# Patient Record
Sex: Female | Born: 1975
Health system: Southern US, Community
[De-identification: ages and names within clinical notes are randomized; demographics above are authoritative.]

## PROBLEM LIST (undated history)

## (undated) DIAGNOSIS — I10 Essential (primary) hypertension: Secondary | ICD-10-CM

## (undated) DIAGNOSIS — Z87768 Personal history of other specified (corrected) congenital malformations of integument, limbs and musculoskeletal system: Secondary | ICD-10-CM

## (undated) DIAGNOSIS — F410 Panic disorder [episodic paroxysmal anxiety] without agoraphobia: Secondary | ICD-10-CM

## (undated) DIAGNOSIS — F32A Depression, unspecified: Secondary | ICD-10-CM

## (undated) DIAGNOSIS — K219 Gastro-esophageal reflux disease without esophagitis: Secondary | ICD-10-CM

## (undated) DIAGNOSIS — F429 Obsessive-compulsive disorder, unspecified: Secondary | ICD-10-CM

## (undated) DIAGNOSIS — N6002 Solitary cyst of left breast: Secondary | ICD-10-CM

## (undated) DIAGNOSIS — F329 Major depressive disorder, single episode, unspecified: Secondary | ICD-10-CM

## (undated) DIAGNOSIS — K589 Irritable bowel syndrome without diarrhea: Secondary | ICD-10-CM

## (undated) DIAGNOSIS — D649 Anemia, unspecified: Secondary | ICD-10-CM

## (undated) HISTORY — DX: Depression, unspecified: F32.A

## (undated) HISTORY — DX: Panic disorder (episodic paroxysmal anxiety): F41.0

## (undated) HISTORY — DX: Personal history of other specified (corrected) congenital malformations of integument, limbs and musculoskeletal system: Z87.768

## (undated) HISTORY — PX: MYOMECTOMY: SHX85

## (undated) HISTORY — DX: Major depressive disorder, single episode, unspecified: F32.9

## (undated) HISTORY — DX: Essential (primary) hypertension: I10

## (undated) HISTORY — PX: ENDOMETRIAL ABLATION: SHX621

## (undated) HISTORY — DX: Irritable bowel syndrome, unspecified: K58.9

## (undated) HISTORY — DX: Obsessive-compulsive disorder, unspecified: F42.9

## (undated) HISTORY — DX: Anemia, unspecified: D64.9

## (undated) HISTORY — DX: Solitary cyst of left breast: N60.02

---

## 2001-07-16 ENCOUNTER — Other Ambulatory Visit: Admission: RE | Admit: 2001-07-16 | Discharge: 2001-07-16 | Payer: Self-pay | Admitting: Obstetrics and Gynecology

## 2001-11-30 ENCOUNTER — Inpatient Hospital Stay (HOSPITAL_COMMUNITY): Admission: AD | Admit: 2001-11-30 | Discharge: 2001-11-30 | Payer: Self-pay | Admitting: Obstetrics and Gynecology

## 2002-01-02 ENCOUNTER — Inpatient Hospital Stay (HOSPITAL_COMMUNITY): Admission: AD | Admit: 2002-01-02 | Discharge: 2002-01-02 | Payer: Self-pay | Admitting: Obstetrics and Gynecology

## 2002-01-26 ENCOUNTER — Inpatient Hospital Stay (HOSPITAL_COMMUNITY): Admission: AD | Admit: 2002-01-26 | Discharge: 2002-01-28 | Payer: Self-pay | Admitting: Obstetrics and Gynecology

## 2002-01-26 ENCOUNTER — Encounter (INDEPENDENT_AMBULATORY_CARE_PROVIDER_SITE_OTHER): Payer: Self-pay | Admitting: Specialist

## 2002-01-27 HISTORY — PX: TUBAL LIGATION: SHX77

## 2002-03-11 ENCOUNTER — Other Ambulatory Visit: Admission: RE | Admit: 2002-03-11 | Discharge: 2002-03-11 | Payer: Self-pay | Admitting: Obstetrics and Gynecology

## 2009-06-05 ENCOUNTER — Emergency Department (HOSPITAL_COMMUNITY): Admission: EM | Admit: 2009-06-05 | Discharge: 2009-06-05 | Payer: Self-pay | Admitting: Family Medicine

## 2010-06-18 ENCOUNTER — Ambulatory Visit (HOSPITAL_BASED_OUTPATIENT_CLINIC_OR_DEPARTMENT_OTHER): Admission: RE | Admit: 2010-06-18 | Discharge: 2010-06-18 | Payer: Self-pay | Admitting: Obstetrics & Gynecology

## 2010-10-10 ENCOUNTER — Ambulatory Visit (HOSPITAL_COMMUNITY): Admission: RE | Admit: 2010-10-10 | Discharge: 2010-10-10 | Payer: Self-pay | Admitting: Obstetrics & Gynecology

## 2011-02-05 ENCOUNTER — Other Ambulatory Visit: Payer: Self-pay | Admitting: Dermatology

## 2011-03-06 LAB — CBC
HCT: 35.1 % — ABNORMAL LOW (ref 36.0–46.0)
MCHC: 34.5 g/dL (ref 30.0–36.0)
Platelets: 329 10*3/uL (ref 150–400)
RDW: 13.3 % (ref 11.5–15.5)
WBC: 7.9 10*3/uL (ref 4.0–10.5)

## 2011-03-10 LAB — ESTRADIOL: Estradiol: 115.5 pg/mL

## 2011-04-01 LAB — POCT URINALYSIS DIP (DEVICE)
Bilirubin Urine: NEGATIVE
Hgb urine dipstick: NEGATIVE
Ketones, ur: NEGATIVE mg/dL
Nitrite: NEGATIVE
Protein, ur: 30 mg/dL — AB
pH: 6.5 (ref 5.0–8.0)

## 2011-05-10 NOTE — Op Note (Signed)
[facility]  Patient:    Sandra Nichols, Sandra Nichols Visit Number: [id number] MRN: [id number]          Service Type: OBS Location: 910A 9142 01 Attending Physician:  Wandalee Ferdinand Dictated by:   Rudy Jew Ashley Royalty, M.D. Proc. Date: 01/27/02 Admit Date:  01/26/2002 Discharge Date: 01/28/2002                             Operative Report  PREOPERATIVE DIAGNOSES:       1. Desire for attempt at permanent surgical                                  sterilization.                               2. Status post vaginal delivery, January 26, 2002.  POSTOPERATIVE DIAGNOSES:      1. Desire for attempt at permanent surgical                                  sterilization.                               2. Status post vaginal delivery, January 26, 2002.  OPERATION:                    Bilateral tubal sterilization procedure (left -                               Parkland, right - Pomeroy).  SURGEON:                      Rudy Jew. Ashley Royalty, M.D.  ANESTHESIA:                   Epidural.  ESTIMATED BLOOD LOSS:         50 cc.  COMPLICATIONS:                None.  PACKS AND DRAINS:             None.  DESCRIPTION OF PROCEDURE:     The patient was taken to the operating room and placed in the dorsal supine position.  The epidural was dosed for surgical anesthesia.  The abdomen was prepped and draped in the usual manner for abdominal surgery.  A 1.5 cm infraumbilical incision was made in the transverse plane.  The subcutaneous tissues were sharply and bluntly dissected down to the fascia which was elevated with hemostats and incised with Mayo scissors.  The incision was extended transversely.  Beneath the fascia, the peritoneum was identified and elevated with hemostats, and entered atraumatically with Metzenbaum scissors.  Small Richardson retractors were used to identify the appropriate  anatomy.   Initially, there were difficulties elevating the omentum and the bowel above the level of the uterus such that the fallopian tube could be visualized.  However, ultimately, the left fallopian tube was visualized.  It was grasped with a Babcock clamp and traced to its fimbriated end.  An avascular area in the distal isthmic to proximal ampullary portion was chosen for ligation.  A small tear was noted in the mesosalpinx.  Hence, an incision was made to perform a Parkland sterilization procedure.  A #1 plain catgut suture was placed on either end of the chosen segment of the tube to be ligated.  Sutures were tied.  A second suture was placed in the same locations.  The intervening portion of fallopian tube was excised and submitted to pathology for histologic studies.  There was a small bleeder noted in the mesosalpinx.  It was controlled with a 3-0 chromic interrupted figure-of-eight suture.  The remainder of the mesosalpinx was thoroughly inspected.  A couple of small bleeders were controlled with the Bovie.  Hemostasis was noted.  The remainder of the fallopian tube was allowed to return to the abdominal cavity.  The right fallopian tube was then identified after elevating the omentum and bowel cephalad.  The right fallopian tube was traced to its fimbriated end. An avascular area in the distal isthmic to proximal ampullary portion was chosen for ligation.  A #1 plain catgut suture was placed on this segment of tube and tied securely.  A second suture was placed and tied as well.  The intervening portion of the fallopian tube was excised with Metzenbaum scissors and submitted to pathology for histologic studies.  Hence, a Parkland procedure was performed on the left and a Pomeroy on the right.  Hemostasis was noted and the remainder of the fallopian tube was allowed to return to the abdominal cavity.  All instruments were removed.  The fascia was then closed with 0 Vicryl in a running  fashion.  The skin was closed with 3-0 chromic in a subcuticular fashion.  Approximately 10 cc of 0.25% bupivacaine were instilled into the abdominal incision to aid in postoperative analgesia.  The patient tolerated the procedure extremely well and was returned to the recovery room in good condition.  Due to the small laceration in the left mesosalpinx, we will obtain serial CBCs postoperatively to make sure there is no additional difficulty with hemostasis. Dictated by:   Rudy Jew Ashley Royalty, M.D. Attending Physician:  Wandalee Ferdinand DD:  01/27/02 TD:  01/28/02 Job: [id number]

## 2011-05-10 NOTE — Discharge Summary (Signed)
[facility]  Patient:    Sandra Nichols, Sandra Nichols Visit Number: [id number] MRN: [id number]          Service Type: OBS Location: 910A 9142 01 Attending Physician:  Wandalee Ferdinand Dictated by:   Rudy Jew Ashley Royalty, M.D. Admit Date:  01/26/2002 Discharge Date: 01/28/2002                             Discharge Summary  DISCHARGE DIAGNOSES:          1. Intrauterine pregnancy at [redacted] weeks                                  gestation, delivered.                               2. Term birth living child, vertex.                               3. Asthma.                               4. Rh negative.                               5. Group B Streptococcus positive.                               6. Musculoskeletal discomfort.                               7. Desire for attempted permanent surgical                                  sterilization.  PROCEDURES:                   1. Obstetrical delivery.                               2. Postpartum tubal sterilization procedure                                  (left Parkland, right Pomeroy).  CONSULTATIONS:                None.  DISCHARGE MEDICATIONS:        1. Tylox.                               2. Chromagen.  HISTORY OF PRESENT ILLNESS:   A 35 year old gravida 5, para 2, AB 2, at [redacted] weeks gestation.  Prenatal care was complicated by asthma, headaches followed by Dr. Orlin Hilding, Rh negative (received RhoGAM at [redacted] weeks gestation).  Also complicated by musculoskeletal discomfort.  The patient was admitted for induction secondary to musculoskeletal discomfort with advanced cervical changes at term.  She was group B  strep positive.  For the remaining history and physical, please see chart.  HOSPITAL COURSE:              The patient was admitted to [facility].  Initial laboratory studies were drawn.  Initial cervical examination revealed the cervix to be 3+ cm dilated, 75% effaced, and -1 to -2 station,  and vertex presentation.  Artificial rupture of membranes was accomplished, which revealed clear fluid.  The patient was given appropriate antibiotics.  She went on to labor and deliver on January 26, 2002, a 10 pound 4 ounce female, Apgars 8 at one minute and 9 at five minutes, sent to the newborn nursery.  Delivery was complicated by shoulder dystocia, which responded to episiotomy and McRoberts maneuver along with suprapubic pressure. There was no sequela.  The pediatrics team was at the delivery, and no evidence of any fracture was observed.  The arterial cord pH was 7.31.  There was a nuchal cord x1.  After delivery patient stated a desire for attempted permanent surgical sterilization.  This procedure was accomplished on January 27, 2002, satisfactorily.  The left fallopian tube underwent a Parkland procedure.  The right fallopian tube underwent a Pomeroy procedure.  The patient was then discharged home January 28, 2002, afebrile and in satisfactory condition.  ACCESSORY CLINICAL FINDINGS:  Hemoglobin and hematocrit on admission were 9.5 and 28.8, respectively.  On January 28 2002 the values were 8.2 and 25.5, respectively.  DISPOSITION:                  The patient will return to [facility] in four to six weeks for postpartum evaluation. Dictated by:   Rudy Jew Ashley Royalty, M.D. Attending Physician:  Wandalee Ferdinand DD:  03/07/02 TD:  03/08/02 Job: [id number]

## 2012-01-31 ENCOUNTER — Encounter (INDEPENDENT_AMBULATORY_CARE_PROVIDER_SITE_OTHER): Payer: Self-pay | Admitting: General Surgery

## 2012-02-05 ENCOUNTER — Ambulatory Visit (INDEPENDENT_AMBULATORY_CARE_PROVIDER_SITE_OTHER): Payer: BC Managed Care – PPO | Admitting: General Surgery

## 2012-02-05 ENCOUNTER — Encounter (INDEPENDENT_AMBULATORY_CARE_PROVIDER_SITE_OTHER): Payer: Self-pay | Admitting: General Surgery

## 2012-02-05 VITALS — BP 142/94 | HR 96 | Temp 97.5°F | Resp 16 | Ht 63.5 in | Wt 265.0 lb

## 2012-02-05 NOTE — Progress Notes (Signed)
Patient ID: Sandra Nichols, female   DOB: 02-Apr-1976, 36 y.o.   MRN: [id number]  Chief Complaint  Patient presents with  . Other    new bariatric- lap band    HPI Sandra Nichols is a 36 y.o. female.   HPI 36 year old Caucasian female referred by her primary care physician Dr Leda Quail for evaluation for weight loss surgery. The patient states that she is interested in weight loss surgery because she has been unsuccessful at sustained weight loss despite numerous attempts. She has tried the [employer], [employer], the [employer], and phentermine-all without any long-term success. Her most successful diet was with the [employer] and she lost 75 pounds. However it she regained all the weight back plus some additional weight.   Past Medical History  Diagnosis Date  . Irritable bowel syndrome   . Anemia   . Panic attack   . OCD (obsessive compulsive disorder)     Past Surgical History  Procedure Date  . Tubal ligation 01/27/2002  . Myomectomy   . Endometrial ablation     Family History  Problem Relation Age of Onset  . Heart disease Mother   . Diabetes Mother   . Diabetes Sister     Social History History  Substance Use Topics  . Smoking status: Never Smoker   . Smokeless tobacco: Not on file  . Alcohol Use: 1.0 - 1.5 oz/week    2-3 drink(s) per week    Allergies  Allergen Reactions  . Prednisone     Current Outpatient Prescriptions  Medication Sig Dispense Refill  . clonazePAM (KLONOPIN) 0.5 MG tablet Take 0.5 mg by mouth 2 (two) times daily as needed.      . ergocalciferol (VITAMIN D2) 50000 UNITS capsule Take 50,000 Units by mouth once a week.      Marland Kitchen FLUoxetine (PROZAC) 40 MG capsule Take 40 mg by mouth daily.      . IRON PO Take 65 mg by mouth daily.      . zaleplon (SONATA) 10 MG capsule Take 10 mg by mouth at bedtime.        Review of Systems Review of Systems  Constitutional: Negative for fever, activity change, appetite change and unexpected  weight change.  HENT: Negative for hearing loss, neck pain, neck stiffness, postnasal drip and tinnitus.   Eyes: Negative for photophobia, discharge and visual disturbance.  Respiratory: Negative for apnea, chest tightness, shortness of breath, wheezing and stridor.        DOE with 1 flight of stairs. Able to walk around shopping store without DOE. No orthopnea, no PND, no CP  Cardiovascular: Negative for chest pain, palpitations and leg swelling.  Gastrointestinal: Negative for nausea, vomiting, abdominal pain, blood in stool and abdominal distention.       Has some occasional heartburn. Has rare episodes of constipation/diarrhea. Daily BMs.  Genitourinary: Negative for dysuria, urgency, hematuria, difficulty urinating and pelvic pain.       Will occasionally have some stress incontinence with coughing, sneezing. Doesn't have to wear a pad. Has irreg cycles. Had endometrial ablation several yrs ago for heavy periods; G4P3  Musculoskeletal: Negative for back pain and arthralgias.  Skin: Negative for color change and pallor.  Neurological: Negative for dizziness, tremors, seizures, speech difficulty, numbness and headaches.  Hematological: Negative for adenopathy.  Psychiatric/Behavioral: Negative for suicidal ideas, sleep disturbance and self-injury.       Sees a psychologist for OCD. Has "emotional eating"    Blood  pressure 142/94, pulse 96, temperature 97.5 F (36.4 C), temperature source Temporal, resp. rate 16, height 5' 3.5" (1.613 m), weight 265 lb (120.203 kg).  Physical Exam Physical Exam  Vitals reviewed. Constitutional: She is oriented to person, place, and time. She appears well-developed and well-nourished. No distress.       Morbidly obese  HENT:  Head: Normocephalic and atraumatic.  Right Ear: External ear normal.  Left Ear: External ear normal.  Eyes: Conjunctivae are normal. No scleral icterus.  Neck: Normal range of motion. Neck supple. No tracheal deviation present.  No thyromegaly present.  Cardiovascular: Normal rate, regular rhythm, normal heart sounds and intact distal pulses.   Pulmonary/Chest: Effort normal and breath sounds normal. No respiratory distress. She has no wheezes.  Abdominal: Soft. Bowel sounds are normal. She exhibits no distension. There is no tenderness. There is no rebound.  Musculoskeletal: Normal range of motion. She exhibits no edema and no tenderness.  Lymphadenopathy:    She has no cervical adenopathy.  Neurological: She is alert and oriented to person, place, and time. She exhibits normal muscle tone.  Skin: Skin is warm and dry. No rash noted. She is not diaphoretic. No erythema. No pallor.  Psychiatric: She has a normal mood and affect. Her behavior is normal. Judgment and thought content normal.    Data Reviewed Self reported weight loss history form  Assessment    Morbid obesity OCD Possible stress urinary incontinence    Plan    The patient meets weight loss surgery criteria and I believe she is a candidate for surgery. She is undecided as to which procedure so we discussed both.   We discussed laparoscopic adjustable gastric banding. The patient was given Agricultural engineer. We discussed the risk and benefits of surgery including but not limited to bleeding, infection, injury to surrounding structures, blood clot formation such as deep venous thrombosis or pulmonary embolism, need to convert to an open procedure, band slippage, band erosion, failure to loose weight, port complications (leak or flippage), potential need for reoperative surgery, esophageal dilatation, worsening reflux, and vitamin deficiencies. We discussed the typical post operative recovery course. We discussed that their postoperative diet will be modified for several weeks. We specifically talked about the need to be on a liquid diet for one to 2 weeks after surgery. We also discussed the typical postoperative course with a laparoscopic adjustable  gastric band and the need for frequent postoperative visits to assess the volume status of the band.  We discussed the typical expected weight loss with a laparoscopic adjustable gastric band. I explained to the patient that they can expect to lose 40-60% of their excess body weight if they are compliant with their postoperative instructions. However I did explain that some patients loose less than 40% and some patients lose more than 60% of their excess body weight.  I explained that the likelihood of improvement in their obesity is good.  We discussed laparoscopic Roux-en-Y gastric bypass. The patient was given educational material. I quoted the patient that they can expect to lose 50-70% of their excess weight with the gastric bypass. We did discuss the possibility of weight regain several years after the procedure.  We discussed the risk and benefits of surgery including but not limited to anesthesia risk, bleeding, infection, blood clot formation, anastomotic leak, anastomotic stricture, ulcer formation, death, respiratory complications, intestinal blockage, internal hernia, gallstone formation, vitamin and nutritional deficiencies, injury to surrounding structures, failure to lose weight and mood changes.  We discussed that  before and after surgery that there would be an alteration in their diet. I explained that we have put them on a diet 2 weeks before surgery. I also explained that they would be on a liquid diet for 2 weeks after surgery. We discussed that they would have to avoid certain foods such as sugar after surgery. We discussed the importance of physical activity as well as compliance with our dietary and supplement recommendations.  The patient is going to think about the procedures and contact us with her decision. In the interim, we will start with our preop evaluation.   Mary Sella. Andrey Campanile, MD, FACS General, Bariatric, & Minimally Invasive Surgery [facility], [place]          [facility] M 02/05/2012, 1:26 PM

## 2012-02-05 NOTE — Patient Instructions (Signed)
Call my office once you have decided on which you procedure you would like or call if you have additional questions

## 2012-02-13 ENCOUNTER — Ambulatory Visit (HOSPITAL_COMMUNITY)
Admission: RE | Admit: 2012-02-13 | Discharge: 2012-02-13 | Disposition: A | Discharge status: Home / Self Care | Payer: BC Managed Care – PPO | Source: Ambulatory Visit | Attending: General Surgery | Admitting: General Surgery

## 2012-02-13 ENCOUNTER — Other Ambulatory Visit: Payer: Self-pay

## 2012-02-13 ENCOUNTER — Telehealth (INDEPENDENT_AMBULATORY_CARE_PROVIDER_SITE_OTHER): Payer: Self-pay | Admitting: General Surgery

## 2012-02-13 DIAGNOSIS — K219 Gastro-esophageal reflux disease without esophagitis: Secondary | ICD-10-CM | POA: Insufficient documentation

## 2012-02-13 DIAGNOSIS — K449 Diaphragmatic hernia without obstruction or gangrene: Secondary | ICD-10-CM | POA: Insufficient documentation

## 2012-02-13 DIAGNOSIS — K8020 Calculus of gallbladder without cholecystitis without obstruction: Secondary | ICD-10-CM | POA: Insufficient documentation

## 2012-02-13 DIAGNOSIS — I517 Cardiomegaly: Secondary | ICD-10-CM | POA: Insufficient documentation

## 2012-02-13 DIAGNOSIS — Z6841 Body Mass Index (BMI) 40.0 and over, adult: Secondary | ICD-10-CM | POA: Insufficient documentation

## 2012-02-13 LAB — LIPID PANEL
Cholesterol: 200 mg/dL (ref 0–200)
HDL: 45 mg/dL (ref >39)
LDL Cholesterol: 131 mg/dL — ABNORMAL HIGH (ref 0–99)
Total CHOL/HDL Ratio: 4.4 Ratio
Triglycerides: 118 mg/dL (ref <150)
VLDL: 24 mg/dL (ref 0–40)

## 2012-02-13 LAB — COMPREHENSIVE METABOLIC PANEL
ALT: 30 U/L (ref 0–35)
AST: 17 U/L (ref 0–37)
Albumin: 4.2 g/dL (ref 3.5–5.2)
Alkaline Phosphatase: 104 U/L (ref 39–117)
BUN: 13 mg/dL (ref 6–23)
CO2: 24 mEq/L (ref 19–32)
Calcium: 9.5 mg/dL (ref 8.4–10.5)
Chloride: 102 mEq/L (ref 96–112)
Creat: 0.63 mg/dL (ref 0.50–1.10)
Glucose, Bld: 88 mg/dL (ref 70–99)
Potassium: 4.3 mEq/L (ref 3.5–5.3)
Sodium: 137 mEq/L (ref 135–145)
Total Bilirubin: 0.6 mg/dL (ref 0.3–1.2)
Total Protein: 7.6 g/dL (ref 6.0–8.3)

## 2012-02-13 LAB — H. PYLORI ANTIBODY, IGG: H Pylori IgG: 0.49 {ISR}

## 2012-02-13 LAB — CBC WITH DIFFERENTIAL/PLATELET
Basophils Relative: 0 % (ref 0–1)
Eosinophils Absolute: 0.1 10*3/uL (ref 0.0–0.7)
HCT: 40.1 % (ref 36.0–46.0)
Hemoglobin: 12.8 g/dL (ref 12.0–15.0)
MCH: 28.8 pg (ref 26.0–34.0)
MCHC: 31.9 g/dL (ref 30.0–36.0)
Monocytes Absolute: 0.3 10*3/uL (ref 0.1–1.0)
Monocytes Relative: 4 % (ref 3–12)
RDW: 14 % (ref 11.5–15.5)

## 2012-02-13 LAB — TSH: TSH: 2.357 u[IU]/mL (ref 0.350–4.500)

## 2012-02-13 LAB — T4: T4, Total: 10.5 ug/dL (ref 5.0–12.5)

## 2012-02-13 NOTE — Telephone Encounter (Signed)
Message copied by Liliana Cline on Thu Feb 13, 2012  8:59 AM ------      Message from: Andrey Campanile, ERIC M      Created: Thu Feb 13, 2012  8:16 AM       pls forward results to pt's PCP

## 2012-02-13 NOTE — Telephone Encounter (Signed)
Recent labs faxed to Dr Leda Quail.

## 2012-02-24 ENCOUNTER — Encounter: Payer: Self-pay | Admitting: *Deleted

## 2012-02-24 ENCOUNTER — Encounter: Payer: BC Managed Care – PPO | Attending: General Surgery | Admitting: *Deleted

## 2012-02-24 DIAGNOSIS — Z713 Dietary counseling and surveillance: Secondary | ICD-10-CM | POA: Insufficient documentation

## 2012-02-24 DIAGNOSIS — Z01818 Encounter for other preprocedural examination: Secondary | ICD-10-CM | POA: Insufficient documentation

## 2012-02-24 NOTE — Progress Notes (Addendum)
  Pre-Op Assessment Visit:  Pre-Operative Gastric Bypass Surgery  Medical Nutrition Therapy:  Appt start time: 1000 end time:  1100.  Patient was seen on 02/24/2012 for Pre-Operative Gastric Bypass Nutrition Assessment. Assessment and letter of approval faxed to [facility] Bariatric Surgery Program coordinator on 02/24/12.  Approval letter sent to [facility] and will be available in the chart under the media tab.  TANITA  BODY COMP RESULTS  02/24/12     %Fat 54.3     FM (lbs) 141.5     FFM (lbs) 119.5     TBW (lbs) 87.5      Handouts given during visit include:  Pre-Op Goals Handout  Protein Supplement Handout  Bariatric Surgery Support Group Schedule  Patient to call for Pre-Op and Post-Op Nutrition Education at [facility] when surgery is scheduled.

## 2012-02-24 NOTE — Patient Instructions (Signed)
   Follow Pre-Op Nutrition Goals to prepare for Gastric Bypass Surgery.   Call the [facility] at [phone] once you have been given your surgery date to enrolled in the Pre-Op Nutrition Class. You will need to attend this nutrition class 3-4 weeks prior to your surgery. 

## 2012-03-18 ENCOUNTER — Other Ambulatory Visit (INDEPENDENT_AMBULATORY_CARE_PROVIDER_SITE_OTHER): Payer: Self-pay | Admitting: General Surgery

## 2012-03-26 ENCOUNTER — Encounter: Payer: BC Managed Care – PPO | Attending: General Surgery | Admitting: *Deleted

## 2012-03-26 DIAGNOSIS — Z713 Dietary counseling and surveillance: Secondary | ICD-10-CM | POA: Insufficient documentation

## 2012-03-26 DIAGNOSIS — Z01818 Encounter for other preprocedural examination: Secondary | ICD-10-CM | POA: Insufficient documentation

## 2012-03-26 NOTE — Patient Instructions (Addendum)
Follow:   Pre-Op Diet per MD 2 weeks prior to surgery  Phase 2- Liquids (clear/full) 2 weeks after surgery  Vitamin/Mineral/Calcium guidelines for purchasing bariatric supplements  Exercise guidelines pre and post-op per MD  Follow-up at [facility] in 2 weeks post-op for diet advancement. Contact Mohannad Olivero as needed with questions/concerns. 

## 2012-03-26 NOTE — Progress Notes (Addendum)
  Bariatric Class:  Appt start time: 0830 end time:  0930.  Pre-Operative Nutrition Class  Patient was seen on 03/26/2012 for Pre-Operative Bariatric Surgery Education at the [facility].  Surgery date: 04/13/12 Surgery type: Gastric Bypass  Samples given per MNT protocol: Bariatric Advantage Multivitamin [facility]) Lot #  [id number] Exp: 09/13  Bariatric Advantage Calcium Citrate Lozenge (Chocolate) Lot # [id number] Exp: 09/13  Bariatric Advantage B-12 (Peppermint) Lot # [id number] Exp: 05/13  Celebrate Vitamins Calcium Citrate (Strawberry Creme) Lot # [id number] Exp: 04/13  Corliss Marcus Protein Powder (Chocolate Splendor) Lot # [id number] Exp: 05/14  The following the learning objective met by the patient during this course:   Identifies Pre-Op Dietary Goals and will begin 2 weeks pre-operatively  Identifies appropriate sources of fluids and proteins   States protein recommendations and appropriate sources pre and post-operatively  Identifies Post-Operative Dietary Goals and will follow for 2 weeks post-operatively  Identifies appropriate multivitamin and calcium sources  Describes the need for physical activity post-operatively and will follow MD recommendations  States when to call healthcare provider regarding medication questions or post-operative complications  Handouts given during class include:  Pre-Op Bariatric Surgery Diet Handout  Protein Shake Handout  Post-Op Bariatric Surgery Nutrition Handout  BELT Program Information Flyer  Support Group Information Flyer  Follow-Up Plan: Patient will follow-up at [facility] 2 weeks post operatively for diet advancement per MD.

## 2012-04-10 ENCOUNTER — Encounter (HOSPITAL_COMMUNITY)
Admission: RE | Admit: 2012-04-10 | Discharge: 2012-04-10 | Disposition: A | Discharge status: Home / Self Care | Payer: BC Managed Care – PPO | Source: Ambulatory Visit | Attending: General Surgery | Admitting: General Surgery

## 2012-04-10 ENCOUNTER — Encounter (HOSPITAL_COMMUNITY): Payer: Self-pay

## 2012-04-10 HISTORY — DX: Gastro-esophageal reflux disease without esophagitis: K21.9

## 2012-04-10 LAB — COMPREHENSIVE METABOLIC PANEL
AST: 32 U/L (ref 0–37)
Albumin: 4.2 g/dL (ref 3.5–5.2)
Alkaline Phosphatase: 95 U/L (ref 39–117)
BUN: 13 mg/dL (ref 6–23)
Chloride: 99 mEq/L (ref 96–112)
Potassium: 4.2 mEq/L (ref 3.5–5.1)
Total Bilirubin: 0.5 mg/dL (ref 0.3–1.2)

## 2012-04-10 LAB — CBC
HCT: 39 % (ref 36.0–46.0)
Platelets: 356 10*3/uL (ref 150–400)
RBC: 4.61 MIL/uL (ref 3.87–5.11)
RDW: 13.2 % (ref 11.5–15.5)
WBC: 7.1 10*3/uL (ref 4.0–10.5)

## 2012-04-10 LAB — DIFFERENTIAL
Basophils Relative: 0 % (ref 0–1)
Eosinophils Absolute: 0.1 10*3/uL (ref 0.0–0.7)
Monocytes Absolute: 0.3 10*3/uL (ref 0.1–1.0)
Monocytes Relative: 4 % (ref 3–12)

## 2012-04-10 NOTE — Patient Instructions (Signed)
20 Sandra Nichols  04/10/2012   Your procedure is scheduled on: 04/20/12   Monday   Surgery [id number]    Report to [facility] at  0515     AM.  Call this number if you have problems the morning of surgery: [phone]     Or PST   [phone]  [facility]   Remember:             Stop aspirin, antiinflammatories or herbal meds 7 days pre op  Do not eat food:After Midnight.Sunday  Night  Or as  directed by office for BOWEL PREP  May have clear liquids: until midnight or bedtime Sunday NIGHT          INCREASE FLUIDS SUNDAY  Clear liquids include soda, tea, black coffee, apple or grape juice, broth.  Take these medicines the morning of surgery with A SIP OF WATER:PROZAC        Klonopin if needed   Do not wear jewelry, make-up or nail polish.  Do not wear lotions, powders, or perfumes. You may wear deodorant.  Do not shave 48 hours prior to surgery.  Do not bring valuables to the hospital.  Contacts, dentures or bridgework may not be worn into surgery.  Leave suitcase in the car. After surgery it may be brought to your room.  For patients admitted to the hospital, checkout time is 11:00 AM the day of discharge.   Patients discharged the day of surgery will not be allowed to drive home.  Name and phone number of your driver: [name]                                                                     Special Instructions: CHG Shower Use Special Wash: 1/2 bottle night before surgery and 1/2 bottle morning of surgery. REGULAR SOAP FACE AND PRIVATES              LADIES- NO SHAVING 48 HOURS BEFORE USING BETASEPT SOAP.                 Please read over the following fact sheets that you were given: MRSA Information

## 2012-04-10 NOTE — Pre-Procedure Instructions (Signed)
PST NOTE- states hasnt been on any estrogens, progesterones x 1 month, has appt with Dr Andrey Campanile 04/13/12- clear liquid information given and informed they would give her the bowel prep instr and scripts

## 2012-04-10 NOTE — Pre-Procedure Instructions (Signed)
EKG and chest x ray 2/13 EPIC

## 2012-04-13 ENCOUNTER — Ambulatory Visit (INDEPENDENT_AMBULATORY_CARE_PROVIDER_SITE_OTHER): Payer: BC Managed Care – PPO | Admitting: General Surgery

## 2012-04-13 ENCOUNTER — Encounter (INDEPENDENT_AMBULATORY_CARE_PROVIDER_SITE_OTHER): Payer: Self-pay | Admitting: General Surgery

## 2012-04-13 VITALS — BP 134/82 | HR 72 | Temp 97.4°F | Resp 16 | Ht 63.0 in | Wt 262.1 lb

## 2012-04-13 MED ORDER — PEG 3350-KCL-NABCB-NACL-NASULF 236 G PO SOLR: 4.0000 L | Freq: Once | ORAL | Status: AC

## 2012-04-13 NOTE — Progress Notes (Signed)
Patient ID: Sandra Nichols, female   DOB: 25-Jan-1976, 36 y.o.   MRN: [id number]  Chief Complaint  Patient presents with  . Bariatric Pre-op    HPI Sandra Nichols is a 36 y.o. female.   HPI 36 year old Caucasian female comes in to discuss her upcoming gastric bypass surgery for this Monday. I last saw her in February 2013. She has completed her preoperative evaluation and has been approved for laparoscopic Roux-en-Y gastric bypass. The patient denies any new medical problems or surgeries. She denies any new medications. She denies any fever, chills, nausea, vomiting, diarrhea or constipation. She denies any trips to the emergency room or the hospital.   Past Medical History  Diagnosis Date  . Irritable bowel syndrome   . Anemia   . Panic attack   . OCD (obsessive compulsive disorder)   . GERD (gastroesophageal reflux disease)     Past Surgical History  Procedure Date  . Myomectomy   . Endometrial ablation   . Tubal ligation 01/27/2002    Family History  Problem Relation Age of Onset  . Heart disease Mother   . Diabetes Mother   . Diabetes Sister   . Cancer Paternal Grandmother     colon    Social History History  Substance Use Topics  . Smoking status: Never Smoker   . Smokeless tobacco: Never Used  . Alcohol Use: No    Allergies  Allergen Reactions  . Prednisone Other (See Comments)    Psychological rxn: pt reports "losing time"    Current Outpatient Prescriptions  Medication Sig Dispense Refill  . clonazePAM (KLONOPIN) 0.5 MG tablet Take 0.5 mg by mouth 2 (two) times daily as needed. anxiety      . ergocalciferol (VITAMIN D2) 50000 UNITS capsule Take 50,000 Units by mouth once a week. wednesday      . FLUoxetine (PROZAC) 40 MG capsule Take 40 mg by mouth every morning.       . IRON PO Take 65 mg by mouth every other day.       . polyethylene glycol (GOLYTELY) 236 G solution Take 4,000 mLs by mouth once.  4000 mL  0  . zaleplon (SONATA) 10 MG capsule Take 10 mg by  mouth at bedtime as needed. sleep        Review of Systems Review of Systems  Constitutional: Negative for fever, diaphoresis, activity change and unexpected weight change.  HENT: Negative for hearing loss, nosebleeds and neck pain.   Eyes: Negative for photophobia and visual disturbance.  Respiratory: Negative for chest tightness, shortness of breath and wheezing.   Cardiovascular: Negative for chest pain, palpitations and leg swelling.  Gastrointestinal: Negative for nausea, vomiting, abdominal pain, diarrhea, constipation and abdominal distention.  Genitourinary: Negative for dysuria, flank pain and pelvic pain.  Musculoskeletal: Negative for back pain and gait problem.  Skin: Negative for color change and pallor.  Neurological: Negative for seizures, syncope, speech difficulty and light-headedness.  Hematological: Negative for adenopathy.  Psychiatric/Behavioral:       Some ocd    Blood pressure 134/82, pulse 72, temperature 97.4 F (36.3 C), temperature source Temporal, resp. rate 16, height 5\' 3"  (1.6 m), weight 262 lb 2 oz (118.899 kg).  Physical Exam Physical Exam  Vitals reviewed. Constitutional: She is oriented to person, place, and time. She appears well-developed and well-nourished. No distress.  HENT:  Head: Normocephalic and atraumatic.  Right Ear: External ear normal.  Left Ear: External ear normal.  Eyes: Conjunctivae are normal. No  scleral icterus.  Neck: Normal range of motion. Neck supple. No tracheal deviation present. No thyromegaly present.  Cardiovascular: Normal rate, regular rhythm and normal heart sounds.   Pulmonary/Chest: Effort normal and breath sounds normal. No respiratory distress. She has no wheezes.  Abdominal: Soft. Bowel sounds are normal. She exhibits no distension. There is no tenderness. There is no rebound.  Musculoskeletal: Normal range of motion. She exhibits no edema and no tenderness.  Neurological: She is alert and oriented to person,  place, and time. She exhibits normal muscle tone.  Skin: Skin is warm and dry. No rash noted. No erythema.  Psychiatric: She has a normal mood and affect. Her behavior is normal. Judgment and thought content normal.    Data Reviewed My note from 01/2012 UGI - small rt sided hiatal hernia U/S - single gallstone Feb 2013 labs - some mild elevated TG; nml Hgb and Hct  Assessment    Morbid obesity BMI 46.4 Hypertriglyceridemia    Plan    We reviewed her upper gi, u/s, labs.   We discussed the importance of the preoperative diet and bowel prep.   We re-discussed laparoscopic Roux-en-Y gastric bypass. We discussed the preoperative, operative and postoperative process.  I explained the surgery in detail including the performance of an EGD near the end of the surgery and an Upper GI swallow study on POD 1. We discussed the typical hospital course including a 2-3 day stay baring any complications.   The patient was given educational material. I quoted the patient that they can expect to lose 50-70% of their excess weight with the gastric bypass. We did discuss the possibility of weight regain several years after the procedure.  We discussed that before and after surgery that there would be an alteration in their diet. I explained that we have put them on a diet 2 weeks before surgery. I also explained that they would be on a liquid diet for 2 weeks after surgery. We discussed that they would have to avoid certain foods such as sugar after surgery. We discussed the importance of physical activity as well as compliance with our dietary and supplement recommendations and routine follow-up.  All of her and her husband's questions were asked and answered.   LAPAROSCOPIC ROUX-EN-Y GASTRIC BYPASS this Monday.  Sandra Nichols. Andrey Campanile, MD, FACS General, Bariatric, & Minimally Invasive Surgery [facility], [place]         [facility] M 04/13/2012, 2:37 PM

## 2012-04-20 ENCOUNTER — Encounter (HOSPITAL_COMMUNITY)
Admission: RE | Disposition: A | Discharge status: Home / Self Care | Payer: Self-pay | Source: Ambulatory Visit | Attending: General Surgery

## 2012-04-20 ENCOUNTER — Ambulatory Visit (HOSPITAL_COMMUNITY): Payer: BC Managed Care – PPO | Admitting: Certified Registered Nurse Anesthetist

## 2012-04-20 ENCOUNTER — Inpatient Hospital Stay (HOSPITAL_COMMUNITY)
Admission: RE | Admit: 2012-04-20 | Discharge: 2012-04-22 | DRG: 288 | Disposition: A | Discharge status: Home / Self Care | Payer: BC Managed Care – PPO | Source: Ambulatory Visit | Attending: General Surgery | Admitting: General Surgery

## 2012-04-20 ENCOUNTER — Encounter (HOSPITAL_COMMUNITY): Payer: Self-pay | Admitting: Certified Registered Nurse Anesthetist

## 2012-04-20 ENCOUNTER — Encounter (HOSPITAL_COMMUNITY): Payer: Self-pay | Admitting: *Deleted

## 2012-04-20 ENCOUNTER — Encounter (HOSPITAL_COMMUNITY): Payer: Self-pay | Admitting: Surgery

## 2012-04-20 DIAGNOSIS — K2100 Gastro-esophageal reflux disease with esophagitis, without bleeding: Secondary | ICD-10-CM

## 2012-04-20 DIAGNOSIS — K219 Gastro-esophageal reflux disease without esophagitis: Secondary | ICD-10-CM | POA: Diagnosis present

## 2012-04-20 DIAGNOSIS — Z6841 Body Mass Index (BMI) 40.0 and over, adult: Secondary | ICD-10-CM

## 2012-04-20 DIAGNOSIS — F429 Obsessive-compulsive disorder, unspecified: Secondary | ICD-10-CM | POA: Diagnosis present

## 2012-04-20 DIAGNOSIS — Z9884 Bariatric surgery status: Secondary | ICD-10-CM

## 2012-04-20 DIAGNOSIS — E781 Pure hyperglyceridemia: Secondary | ICD-10-CM | POA: Diagnosis present

## 2012-04-20 HISTORY — PX: GASTRIC ROUX-EN-Y: SHX5262

## 2012-04-20 SURGERY — LAPAROSCOPIC ROUX-EN-Y GASTRIC BYPASS WITH UPPER ENDOSCOPY
Anesthesia: General | Site: Abdomen | Wound class: Clean Contaminated

## 2012-04-20 MED ORDER — TISSEEL VH 10 ML EX KIT
PACK | CUTANEOUS | Status: DC | PRN
  Administered 2012-04-20: 10 mL

## 2012-04-20 MED ORDER — UNJURY CHICKEN SOUP POWDER: 2.0000 [oz_av] | Freq: Four times a day (QID) | ORAL | Status: DC

## 2012-04-20 MED ORDER — HYDROMORPHONE HCL PF 1 MG/ML IJ SOLN
INTRAMUSCULAR | Status: DC | PRN
  Administered 2012-04-20: .5 mg via INTRAVENOUS
  Administered 2012-04-20: 0.5 mg via INTRAVENOUS

## 2012-04-20 MED ORDER — LIDOCAINE HCL (CARDIAC) 20 MG/ML IV SOLN
INTRAVENOUS | Status: DC | PRN
  Administered 2012-04-20: 100 mg via INTRAVENOUS

## 2012-04-20 MED ORDER — KETOROLAC TROMETHAMINE 30 MG/ML IJ SOLN: 15.0000 mg | Freq: Once | INTRAMUSCULAR | Status: DC | PRN

## 2012-04-20 MED ORDER — ONDANSETRON HCL 4 MG/2ML IJ SOLN
4.0000 mg | INTRAMUSCULAR | Status: DC | PRN
  Administered 2012-04-20 – 2012-04-22 (×4): 4 mg via INTRAVENOUS
  Filled 2012-04-20 (×4): qty 2

## 2012-04-20 MED ORDER — DEXAMETHASONE SODIUM PHOSPHATE 10 MG/ML IJ SOLN
INTRAMUSCULAR | Status: DC | PRN
  Administered 2012-04-20: 10 mg via INTRAVENOUS

## 2012-04-20 MED ORDER — ONDANSETRON HCL 4 MG/2ML IJ SOLN
INTRAMUSCULAR | Status: DC | PRN
  Administered 2012-04-20: 4 mg via INTRAVENOUS

## 2012-04-20 MED ORDER — HEPARIN SODIUM (PORCINE) 5000 UNIT/ML IJ SOLN
5000.0000 [IU] | Freq: Three times a day (TID) | INTRAMUSCULAR | Status: DC
  Administered 2012-04-20 – 2012-04-22 (×5): 5000 [IU] via SUBCUTANEOUS
  Filled 2012-04-20 (×8): qty 1

## 2012-04-20 MED ORDER — MIDAZOLAM HCL 5 MG/5ML IJ SOLN
INTRAMUSCULAR | Status: DC | PRN
  Administered 2012-04-20: 2 mg via INTRAVENOUS

## 2012-04-20 MED ORDER — UNJURY VANILLA POWDER
2.0000 [oz_av] | Freq: Four times a day (QID) | ORAL | Status: DC
  Administered 2012-04-22: 2 [oz_av] via ORAL

## 2012-04-20 MED ORDER — DROPERIDOL 2.5 MG/ML IJ SOLN
INTRAMUSCULAR | Status: DC | PRN
  Administered 2012-04-20: 0.625 mg via INTRAVENOUS

## 2012-04-20 MED ORDER — LACTATED RINGERS IV SOLN
INTRAVENOUS | Status: DC | PRN
  Administered 2012-04-20 (×2): via INTRAVENOUS

## 2012-04-20 MED ORDER — PROMETHAZINE HCL 25 MG/ML IJ SOLN: 6.2500 mg | INTRAMUSCULAR | Status: DC | PRN

## 2012-04-20 MED ORDER — MORPHINE SULFATE 2 MG/ML IJ SOLN
2.0000 mg | INTRAMUSCULAR | Status: DC | PRN
  Administered 2012-04-20 (×2): 4 mg via INTRAVENOUS
  Administered 2012-04-21: 2 mg via INTRAVENOUS
  Filled 2012-04-20: qty 2
  Filled 2012-04-20: qty 1
  Filled 2012-04-20: qty 2

## 2012-04-20 MED ORDER — FIBRIN SEALANT COMPONENT 5 ML EX KIT
PACK | CUTANEOUS | Status: AC
  Filled 2012-04-20: qty 2

## 2012-04-20 MED ORDER — PROPOFOL 10 MG/ML IV EMUL
INTRAVENOUS | Status: DC | PRN
  Administered 2012-04-20: 200 mg via INTRAVENOUS

## 2012-04-20 MED ORDER — OXYCODONE-ACETAMINOPHEN 5-325 MG/5ML PO SOLN
5.0000 mL | ORAL | Status: DC | PRN
  Administered 2012-04-21: 5 mL via ORAL
  Administered 2012-04-21: 10 mL via ORAL
  Filled 2012-04-20: qty 5
  Filled 2012-04-20: qty 10

## 2012-04-20 MED ORDER — HYDROMORPHONE HCL PF 1 MG/ML IJ SOLN: 0.2500 mg | INTRAMUSCULAR | Status: DC | PRN

## 2012-04-20 MED ORDER — HEPARIN SODIUM (PORCINE) 5000 UNIT/ML IJ SOLN
INTRAMUSCULAR | Status: AC
  Filled 2012-04-20: qty 1

## 2012-04-20 MED ORDER — BUPIVACAINE-EPINEPHRINE 0.25% -1:200000 IJ SOLN
INTRAMUSCULAR | Status: DC | PRN
  Administered 2012-04-20: 35 mL

## 2012-04-20 MED ORDER — ROCURONIUM BROMIDE 100 MG/10ML IV SOLN
INTRAVENOUS | Status: DC | PRN
  Administered 2012-04-20: 10 mg via INTRAVENOUS
  Administered 2012-04-20: 20 mg via INTRAVENOUS
  Administered 2012-04-20 (×3): 10 mg via INTRAVENOUS
  Administered 2012-04-20: 50 mg via INTRAVENOUS

## 2012-04-20 MED ORDER — KCL IN DEXTROSE-NACL 20-5-0.45 MEQ/L-%-% IV SOLN
INTRAVENOUS | Status: DC
  Administered 2012-04-20 – 2012-04-21 (×2): via INTRAVENOUS
  Administered 2012-04-21 – 2012-04-22 (×3): 125 mL via INTRAVENOUS
  Filled 2012-04-20 (×9): qty 1000

## 2012-04-20 MED ORDER — SCOPOLAMINE 1 MG/3DAYS TD PT72
MEDICATED_PATCH | TRANSDERMAL | Status: AC
  Filled 2012-04-20: qty 1

## 2012-04-20 MED ORDER — GLYCOPYRROLATE 0.2 MG/ML IJ SOLN
INTRAMUSCULAR | Status: DC | PRN
  Administered 2012-04-20: .8 mg via INTRAVENOUS

## 2012-04-20 MED ORDER — LACTATED RINGERS IR SOLN
Status: DC | PRN
  Administered 2012-04-20: 3000 mL

## 2012-04-20 MED ORDER — FENTANYL CITRATE 0.05 MG/ML IJ SOLN
INTRAMUSCULAR | Status: DC | PRN
  Administered 2012-04-20: 50 ug via INTRAVENOUS
  Administered 2012-04-20: 100 ug via INTRAVENOUS
  Administered 2012-04-20 (×4): 50 ug via INTRAVENOUS

## 2012-04-20 MED ORDER — CEFOXITIN SODIUM-DEXTROSE 1-4 GM-% IV SOLR (PREMIX)
INTRAVENOUS | Status: AC
  Filled 2012-04-20: qty 100

## 2012-04-20 MED ORDER — SCOPOLAMINE 1 MG/3DAYS TD PT72
MEDICATED_PATCH | TRANSDERMAL | Status: DC | PRN
  Administered 2012-04-20: 1 via TRANSDERMAL

## 2012-04-20 MED ORDER — STERILE WATER FOR IRRIGATION IR SOLN
Status: DC | PRN
  Administered 2012-04-20: 1500 mL

## 2012-04-20 MED ORDER — DEXTROSE 5 % IV SOLN
2.0000 g | INTRAVENOUS | Status: AC
  Administered 2012-04-20: 2 g via INTRAVENOUS
  Filled 2012-04-20: qty 2

## 2012-04-20 MED ORDER — ACETAMINOPHEN 160 MG/5ML PO SOLN: 650.0000 mg | ORAL | Status: DC | PRN

## 2012-04-20 MED ORDER — BUPIVACAINE-EPINEPHRINE 0.25% -1:200000 IJ SOLN
INTRAMUSCULAR | Status: AC
  Filled 2012-04-20: qty 1

## 2012-04-20 MED ORDER — NEOSTIGMINE METHYLSULFATE 1 MG/ML IJ SOLN
INTRAMUSCULAR | Status: DC | PRN
  Administered 2012-04-20: 5 mg via INTRAVENOUS

## 2012-04-20 MED ORDER — 0.9 % SODIUM CHLORIDE (POUR BTL) OPTIME
TOPICAL | Status: DC | PRN
  Administered 2012-04-20: 1000 mL

## 2012-04-20 MED ORDER — UNJURY CHOCOLATE CLASSIC POWDER: 2.0000 [oz_av] | Freq: Four times a day (QID) | ORAL | Status: DC

## 2012-04-20 MED ORDER — HEPARIN SODIUM (PORCINE) 5000 UNIT/ML IJ SOLN
5000.0000 [IU] | INTRAMUSCULAR | Status: AC
  Administered 2012-04-20: 5000 [IU] via SUBCUTANEOUS

## 2012-04-20 MED ORDER — PANTOPRAZOLE SODIUM 40 MG IV SOLR
40.0000 mg | INTRAVENOUS | Status: DC
  Administered 2012-04-20 – 2012-04-21 (×2): 40 mg via INTRAVENOUS
  Filled 2012-04-20 (×3): qty 40

## 2012-04-20 MED ORDER — ACETAMINOPHEN 10 MG/ML IV SOLN
INTRAVENOUS | Status: AC
  Filled 2012-04-20: qty 100

## 2012-04-20 MED ORDER — ACETAMINOPHEN 10 MG/ML IV SOLN
1000.0000 mg | Freq: Four times a day (QID) | INTRAVENOUS | Status: AC
  Administered 2012-04-20 – 2012-04-21 (×4): 1000 mg via INTRAVENOUS
  Filled 2012-04-20 (×7): qty 100

## 2012-04-20 MED ORDER — LORAZEPAM 2 MG/ML IJ SOLN: 0.5000 mg | Freq: Two times a day (BID) | INTRAMUSCULAR | Status: DC | PRN

## 2012-04-20 MED ORDER — ACETAMINOPHEN 10 MG/ML IV SOLN
INTRAVENOUS | Status: DC | PRN
  Administered 2012-04-20: 1000 mg via INTRAVENOUS

## 2012-04-20 SURGICAL SUPPLY — 122 items
ADH SKN CLS APL DERMABOND .7 (GAUZE/BANDAGES/DRESSINGS) ×4
APL SKNCLS STERI-STRIP NONHPOA (GAUZE/BANDAGES/DRESSINGS) ×2
APL SRG 32X5 SNPLK LF DISP (MISCELLANEOUS) ×2
APPLICATOR COTTON TIP 6IN STRL (MISCELLANEOUS) ×2 IMPLANT
APPLIER CLIP ROT 10 11.4 M/L (STAPLE)
APPLIER CLIP ROT 13.4 12 LRG (CLIP)
APR CLP LRG 13.4X12 ROT 20 MLT (CLIP)
APR CLP MED LRG 11.4X10 (STAPLE)
BAG SPEC RTRVL LRG 6X4 10 (ENDOMECHANICALS)
BENZOIN TINCTURE PRP APPL 2/3 (GAUZE/BANDAGES/DRESSINGS) ×3 IMPLANT
BLADE SURG 15 STRL LF DISP TIS (BLADE) IMPLANT
BLADE SURG 15 STRL SS (BLADE)
BLADE SURG SZ11 CARB STEEL (BLADE) ×3 IMPLANT
CABLE HIGH FREQUENCY MONO STRZ (ELECTRODE) ×3 IMPLANT
CANISTER SUCTION 2500CC (MISCELLANEOUS) ×3 IMPLANT
CLAMP ENDO BABCK 10MM (STAPLE) IMPLANT
CLIP APPLIE ROT 10 11.4 M/L (STAPLE) IMPLANT
CLIP APPLIE ROT 13.4 12 LRG (CLIP) IMPLANT
CLIP SUT LAPRA TY ABSORB (SUTURE) ×4 IMPLANT
CLOTH BEACON ORANGE TIMEOUT ST (SAFETY) ×3 IMPLANT
COVER SURGICAL LIGHT HANDLE (MISCELLANEOUS) ×3 IMPLANT
CUTTER LINEAR ENDO ART 45 ETS (STAPLE) IMPLANT
DECANTER SPIKE VIAL GLASS SM (MISCELLANEOUS) ×3 IMPLANT
DERMABOND ADVANCED (GAUZE/BANDAGES/DRESSINGS) ×2
DERMABOND ADVANCED .7 DNX12 (GAUZE/BANDAGES/DRESSINGS) ×2 IMPLANT
DEVICE SUT QUICK LOAD TK 5 (STAPLE) IMPLANT
DEVICE SUT TI-KNOT TK 5X26 (MISCELLANEOUS) IMPLANT
DEVICE SUTURE ENDOST 10MM (ENDOMECHANICALS) ×3 IMPLANT
DISSECTOR BLUNT TIP ENDO 5MM (MISCELLANEOUS) ×3 IMPLANT
DRAIN PENROSE 18X1/2 LTX STRL (DRAIN) ×1 IMPLANT
DRAIN PENROSE 18X1/4 LTX STRL (WOUND CARE) ×3 IMPLANT
DRAPE CAMERA CLOSED 9X96 (DRAPES) ×3 IMPLANT
DRAPE LAPAROSCOPIC ABDOMINAL (DRAPES) ×3 IMPLANT
DRAPE UTILITY XL STRL (DRAPES) ×1 IMPLANT
DRSG TEGADERM 2-3/8X2-3/4 SM (GAUZE/BANDAGES/DRESSINGS) ×3 IMPLANT
ELECT REM PT RETURN 9FT ADLT (ELECTROSURGICAL) ×3
ELECTRODE REM PT RTRN 9FT ADLT (ELECTROSURGICAL) ×2 IMPLANT
FILTER SMOKE EVAC LAPAROSHD (FILTER) IMPLANT
GAUZE SPONGE 2X2 8PLY STRL LF (GAUZE/BANDAGES/DRESSINGS) ×1 IMPLANT
GAUZE SPONGE 4X4 16PLY XRAY LF (GAUZE/BANDAGES/DRESSINGS) ×3 IMPLANT
GLOVE BIO SURGEON STRL SZ7.5 (GLOVE) ×3 IMPLANT
GLOVE BIOGEL M STRL SZ7.5 (GLOVE) IMPLANT
GLOVE BIOGEL PI IND STRL 7.0 (GLOVE) ×2 IMPLANT
GLOVE BIOGEL PI INDICATOR 7.0 (GLOVE) ×1
GLOVE INDICATOR 8.0 STRL GRN (GLOVE) ×3 IMPLANT
GOWN STRL NON-REIN LRG LVL3 (GOWN DISPOSABLE) ×3 IMPLANT
GOWN STRL REIN XL XLG (GOWN DISPOSABLE) ×6 IMPLANT
GRASPER ENDO BABCOCK 10 (MISCELLANEOUS) IMPLANT
GRASPER ENDO BABCOCK 10MM (MISCELLANEOUS)
HAND ACTIVATED (MISCELLANEOUS) ×3 IMPLANT
HEMOSTAT SURGICEL 4X8 (HEMOSTASIS) IMPLANT
HOVERMATT SINGLE USE (MISCELLANEOUS) ×3 IMPLANT
KIT BASIN OR (CUSTOM PROCEDURE TRAY) ×3 IMPLANT
KIT GASTRIC LAVAGE 34FR ADT (SET/KITS/TRAYS/PACK) ×3 IMPLANT
NDL SPNL 22GX3.5 QUINCKE BK (NEEDLE) ×1 IMPLANT
NEEDLE SPNL 22GX3.5 QUINCKE BK (NEEDLE) ×3 IMPLANT
NS IRRIG 1000ML POUR BTL (IV SOLUTION) ×3 IMPLANT
PACK CARDIOVASCULAR III (CUSTOM PROCEDURE TRAY) ×3 IMPLANT
PEN SKIN MARKING BROAD (MISCELLANEOUS) ×3 IMPLANT
PENCIL BUTTON HOLSTER BLD 10FT (ELECTRODE) ×2 IMPLANT
POUCH SPECIMEN RETRIEVAL 10MM (ENDOMECHANICALS) IMPLANT
RELOAD 45 VASCULAR/THIN (ENDOMECHANICALS) ×3 IMPLANT
RELOAD BLUE (STAPLE) ×4 IMPLANT
RELOAD ENDO STITCH 2.0 (ENDOMECHANICALS) ×33
RELOAD GOLD (STAPLE) ×2 IMPLANT
RELOAD STAPLE 45 2.5 WHT GRN (ENDOMECHANICALS) IMPLANT
RELOAD STAPLE 45 3.5 BLU ETS (ENDOMECHANICALS) IMPLANT
RELOAD STAPLE TA45 3.5 REG BLU (ENDOMECHANICALS) ×3 IMPLANT
RELOAD SUT SNGL STCH ABSRB 2-0 (ENDOMECHANICALS) IMPLANT
RELOAD SUT SNGL STCH BLK 2-0 (ENDOMECHANICALS) IMPLANT
RELOAD WHITE ECR60W (STAPLE) ×2 IMPLANT
SCALPEL HARMONIC ACE (MISCELLANEOUS) ×3 IMPLANT
SCISSORS LAP 5X35 DISP (ENDOMECHANICALS) ×3 IMPLANT
SEALANT SURGICAL APPL DUAL CAN (MISCELLANEOUS) ×3 IMPLANT
SET IRRIG TUBING LAPAROSCOPIC (IRRIGATION / IRRIGATOR) ×3 IMPLANT
SLEEVE ADV FIXATION 12X100MM (TROCAR) ×6 IMPLANT
SLEEVE ADV FIXATION 5X100MM (TROCAR) ×3 IMPLANT
SLEEVE ENDOPATH XCEL 5M (ENDOMECHANICALS) ×3 IMPLANT
SLEEVE Z-THREAD 5X100MM (TROCAR) IMPLANT
SOLUTION ANTI FOG 6CC (MISCELLANEOUS) ×3 IMPLANT
SPONGE GAUZE 2X2 STER 10/PKG (GAUZE/BANDAGES/DRESSINGS) ×1
SPONGE GAUZE 4X4 12PLY (GAUZE/BANDAGES/DRESSINGS) ×3 IMPLANT
STAPLE ECHEON FLEX 60 POW ENDO (STAPLE) ×3 IMPLANT
STAPLER VISISTAT 35W (STAPLE) ×3 IMPLANT
STRIP CLOSURE SKIN 1/2X4 (GAUZE/BANDAGES/DRESSINGS) ×2 IMPLANT
SUT ETHILON 3 0 PS 1 (SUTURE) IMPLANT
SUT MNCRL AB 4-0 PS2 18 (SUTURE) ×3 IMPLANT
SUT RELOAD ENDO STITCH 2 48X1 (ENDOMECHANICALS) ×14
SUT RELOAD ENDO STITCH 2.0 (ENDOMECHANICALS) ×8
SUT SILK 2 0 SH (SUTURE) IMPLANT
SUT SURGIDAC NAB ES-9 0 48 120 (SUTURE) ×4 IMPLANT
SUT VIC AB 2-0 SH 27 (SUTURE) ×3
SUT VIC AB 2-0 SH 27X BRD (SUTURE) ×2 IMPLANT
SUT VIC AB 4-0 SH 18 (SUTURE) ×3 IMPLANT
SUTURE RELOAD END STTCH 2 48X1 (ENDOMECHANICALS) ×14 IMPLANT
SUTURE RELOAD ENDO STITCH 2.0 (ENDOMECHANICALS) ×8 IMPLANT
SYR 20CC LL (SYRINGE) ×3 IMPLANT
SYR 30ML LL (SYRINGE) ×3 IMPLANT
SYR 50ML LL SCALE MARK (SYRINGE) ×3 IMPLANT
SYR CONTROL 10ML LL (SYRINGE) ×3 IMPLANT
TIP INNERVISION DETACH 40FR (MISCELLANEOUS) IMPLANT
TIP INNERVISION DETACH 50FR (MISCELLANEOUS) IMPLANT
TIP INNERVISION DETACH 56FR (MISCELLANEOUS) IMPLANT
TIPS INNERVISION DETACH 40FR (MISCELLANEOUS)
TOWEL OR 17X26 10 PK STRL BLUE (TOWEL DISPOSABLE) ×5 IMPLANT
TRAY FOLEY CATH 14FRSI W/METER (CATHETERS) ×3 IMPLANT
TRAY LAP CHOLE (CUSTOM PROCEDURE TRAY) ×3 IMPLANT
TROCAR ADV FIXATION 11X100MM (TROCAR) IMPLANT
TROCAR ADV FIXATION 12X100MM (TROCAR) ×2 IMPLANT
TROCAR ADV FIXATION 5X100MM (TROCAR) IMPLANT
TROCAR BALLN 12MMX100 BLUNT (TROCAR) ×1 IMPLANT
TROCAR BLADELESS OPT 5 100 (ENDOMECHANICALS) ×1 IMPLANT
TROCAR ENDOPATH XCEL 12X100 BL (ENDOMECHANICALS) ×3 IMPLANT
TROCAR XCEL 12X100 BLDLESS (ENDOMECHANICALS) ×3 IMPLANT
TROCAR XCEL BLUNT TIP 100MML (ENDOMECHANICALS) IMPLANT
TROCAR XCEL NON-BLD 11X100MML (ENDOMECHANICALS) IMPLANT
TROCAR Z-THREAD FIOS 11X100 BL (TROCAR) ×1 IMPLANT
TROCAR Z-THREAD FIOS 5X100MM (TROCAR) ×3 IMPLANT
TROCAR Z-THREAD SLEEVE 11X100 (TROCAR) IMPLANT
TUBING ENDO SMARTCAP (MISCELLANEOUS) ×3 IMPLANT
TUBING FILTER THERMOFLATOR (ELECTROSURGICAL) ×3 IMPLANT
WATER STERILE IRR 1500ML POUR (IV SOLUTION) ×3 IMPLANT

## 2012-04-20 NOTE — Transfer of Care (Signed)
Immediate Anesthesia Transfer of Care Note  Patient: Sandra Nichols  Procedure(s) Performed: Procedure(s) (LRB): LAPAROSCOPIC ROUX-EN-Y GASTRIC BYPASS WITH UPPER ENDOSCOPY ()  Patient Location: PACU  Anesthesia Type: general  Level of Consciousness: sedated  Airway & Oxygen Therapy: Patient Spontanous Breathing and Patient connected to face mask oxygen  Post-op Assessment: Report given to PACU RN and Post -op Vital signs reviewed and stable  Post vital signs: Reviewed and stable  Complications: No apparent anesthesia complications

## 2012-04-20 NOTE — Anesthesia Postprocedure Evaluation (Signed)
  Anesthesia Post-op Note  Patient: Sandra Nichols  Procedure(s) Performed: Procedure(s) (LRB): LAPAROSCOPIC ROUX-EN-Y GASTRIC BYPASS WITH UPPER ENDOSCOPY ()  Patient Location: PACU  Anesthesia Type: General  Level of Consciousness: awake and alert   Airway and Oxygen Therapy: Patient Spontanous Breathing  Post-op Pain: mild  Post-op Assessment: Post-op Vital signs reviewed, Patient's Cardiovascular Status Stable, Respiratory Function Stable, Patent Airway and No signs of Nausea or vomiting  Post-op Vital Signs: stable  Complications: No apparent anesthesia complications

## 2012-04-20 NOTE — Anesthesia Preprocedure Evaluation (Signed)
Anesthesia Evaluation  Patient identified by MRN, date of birth, ID band Patient awake    Reviewed: Allergy & Precautions, H&P , NPO status , Patient's Chart, lab work & pertinent test results  Airway Mallampati: II TM Distance: >3 FB Neck ROM: Full    Dental No notable dental hx.    Pulmonary neg pulmonary ROS,  breath sounds clear to auscultation  Pulmonary exam normal       Cardiovascular negative cardio ROS  Rhythm:Regular Rate:Normal     Neuro/Psych negative neurological ROS  negative psych ROS   GI/Hepatic Neg liver ROS, GERD-  ,  Endo/Other  negative endocrine ROSMorbid obesity  Renal/GU negative Renal ROS  negative genitourinary   Musculoskeletal negative musculoskeletal ROS (+)   Abdominal   Peds negative pediatric ROS (+)  Hematology negative hematology ROS (+)   Anesthesia Other Findings   Reproductive/Obstetrics negative OB ROS                           Anesthesia Physical Anesthesia Plan  ASA: II  Anesthesia Plan: General   Post-op Pain Management:    Induction: Intravenous  Airway Management Planned: Oral ETT  Additional Equipment:   Intra-op Plan:   Post-operative Plan: Extubation in OR  Informed Consent: I have reviewed the patients History and Physical, chart, labs and discussed the procedure including the risks, benefits and alternatives for the proposed anesthesia with the patient or authorized representative who has indicated his/her understanding and acceptance.   Dental advisory given  Plan Discussed with: CRNA  Anesthesia Plan Comments:         Anesthesia Quick Evaluation

## 2012-04-20 NOTE — Op Note (Signed)
Upper GI endoscopy is performed at the completion of laparoscopic Roux-en-Y gastric bypass by Dr. Andrey Campanile. The Olympus video endoscope was inserted into the upper esophagus and then passed under direct vision to the EG junction. The small gastric pouch was insufflated with air while the gastric outlet was clamped under irrigation by the operating surgeon. There was no evidence of leak. The anastomosis was visualized and was patent. Suture and staple lines were intact and without bleeding. The pouch was tubular and measured 3 cm in length. At the completion of the procedure the pouch was desufflated and the scope withdrawn.   Mariella Saa MD, FACS  04/20/2012, 10:30 AM

## 2012-04-20 NOTE — Interval H&P Note (Signed)
History and Physical Interval Note:  04/20/2012 7:23 AM  Sandra Nichols  has presented today for surgery, with the diagnosis of morbid obesity   The various methods of treatment have been discussed with the patient and family. After consideration of risks, benefits and other options for treatment, the patient has consented to  Procedure(s) (LRB): LAPAROSCOPIC ROUX-EN-Y GASTRIC (N/A) LAPAROSCOPIC REPAIR OF HIATAL HERNIA (N/A) as a surgical intervention .  The patients' history has been reviewed, patient examined, no change in status, stable for surgery.  I have reviewed the patients' chart and labs.  Questions were answered to the patient's satisfaction.   Mary Sella. Andrey Campanile, MD, FACS General, Bariatric, & Minimally Invasive Surgery [facility], [place]  [facility] M

## 2012-04-20 NOTE — Preoperative (Signed)
Beta Blockers   Reason not to administer Beta Blockers:Not Applicable 

## 2012-04-20 NOTE — Progress Notes (Signed)
Bowel prep done 04/19/12 with good results

## 2012-04-20 NOTE — Op Note (Signed)
Preop diagnosis:  1. Morbid obesity (BMI 46)  2. Hypertriglyceridemia  Postop diagnosis:  1. same  Surgical procedure: Laparoscopic Roux-en-Y gastric bypass (ante-colic, ante-gastric); EGD  Surgeon: Atilano Ina, M.D. FACS  Asst.: Glenna Fellows, MD FACS  Anesthesia: General plus 0.25% marcaine with epi  Complications: None   EBL: Minimal   Drains: None   Disposition: PACU in good condition   Indications for procedure: 36yo white female with morbid obesity who has been unsuccessful at sustained weight loss. The patient's comorbidities are listed above. We discussed the risk and benefits of surgery including but not limited to anesthesia risk, bleeding, infection, blood clot formation, anastomotic leak, anastomotic stricture, ulcer formation, death, respiratory complications, intestinal blockage, internal hernia, gallstone formation, vitamin and nutritional deficiencies, injury to surrounding structures, failure to lose weight and mood changes.   Description of procedure: Patient is brought to the operating room and general anesthesia induced. The patient had received preoperative broad-spectrum IV antibiotics and subcutaneous heparin. The abdomen was widely sterilely prepped with Chloraprep and draped. Patient timeout was performed and correct patient and procedure confirmed. Access was obtained with a 12 mm Optiview trocar in the left upper quadrant and pneumoperitoneum established without difficulty. There was some omental adhesions around and below the umbilicus. Under direct vision 12 mm trocars were placed laterally in the right upper quadrant, right upper quadrant midclavicular line, and to the left and above the umbilicus for the camera port. A 5 mm trocar was placed laterally in the left upper quadrant. The omental attachments to the anterior abdominal wall were taken down with the Harmonic scalpel.  The omentum was brought into the upper abdomen and the transverse mesocolon  elevated and the ligament of Treitz clearly identified. A 40 cm biliopancreatic limb was then carefully measured from the ligament of Treitz. The small intestine was divided at this point with a single firing of the white load linear stapler. A Penrose drain was sutured to the end of the Roux-en-Y limb for later identification. A 100 cm Roux-en-Y limb was then carefully measured. At this point a side-to-side anastomosis was created between the Roux limb and the end of the biliopancreatic limb. This was accomplished with a single firing of the 45 mm white load linear stapler. The common enterotomy was closed with a running 2-0 Vicryl begun at either end of the enterotomy and tied centrally. The mesenteric defect was then closed with running 2-0 silk. The omentum was then divided with the harmonic scalpel up towards the transverse colon to allow mobility of the Roux limb toward the gastric pouch. The patient was then placed in steep reversed Trendelenburg. Through a 5 mm subxiphoid site the [facility] retractor was placed and the left lobe of the liver elevated with excellent exposure of the upper stomach and hiatus. The angle of Hiss was then mobilized with the harmonic scalpel. A 4 cm gastric pouch was then carefully measured along the lesser curve of the stomach. Dissection was carried along the lesser curve at this point with the Harmonic scalpel working carefully back toward the lesser sac at right angles to the lesser curve. The free lesser sac was then entered. After being sure all tubes were removed from the stomach an initial firing of the gold load 60 mm linear stapler was fired at right angles across the lesser curve for about 4 cm. The gastric pouch was further mobilized posteriorly and then the pouch was completed with 2 further firings of the 60 mm blue load linear stapler.  It was ensured that the pouch was completely mobilized away from the gastric remnant. This created a nice tubular 4-5 cm gastric  pouch.  The Roux limb was then brought up in an antecolic fashion with the candycane facing to the patient's left without undue tension. The gastrojejunostomy was created with an initial posterior row of 2-0 Vicryl between the Roux limb and the staple line of the gastric pouch. Enterotomies were then made in the gastric pouch and the Roux limb with the harmonic scalpel and at approximately 2-2-1/2 cm anastomosis was created with a single firing of the 45mm blue load linear stapler. The staple line was inspected and was intact without bleeding. The common enterotomy was then closed with running 2-0 Vicryl begun at either end and tied centrally. The Ewall tube was then easily passed through the anastomosis and an outer anterior layer of running 2-0 Vicryl was placed. The Ewald tube was removed. With the outlet of the gastrojejunostomy clamped and under saline irrigation the assistant performed upper endoscopy and with the gastric pouch tensely distended with air-there was no evidence of leak on this test. The pouch was desufflated. The Vonita Moss defect was closed with running 2-0 silk. The abdomen was inspected for any evidence of bleeding or bowel injury and everything looked fine. The Nathanson retractor was removed under direct vision after coating the anastomosis with Tisseel tissue sealant. All CO2 was evacuated and trochars removed. Skin incisions were closed with 4-0 Monocryl in subcuticular fashion. Benzoin, steri-strips and bandages were applied. Sponge needle and instrument counts were correct. The patient was taken to the PACU in good condition.    Mary Sella. Andrey Campanile, MD, FACS General, Bariatric, & Minimally Invasive Surgery [facility], [place]

## 2012-04-20 NOTE — H&P (View-Only) (Signed)
Patient ID: Sandra Nichols, female   DOB: 06/02/1976, 36 y.o.   MRN: [id number]  Chief Complaint  Patient presents with  . Bariatric Pre-op    HPI Sandra Nichols is a 36 y.o. female.   HPI 36-year-old Caucasian female comes in to discuss her upcoming gastric bypass surgery for this Monday. I last saw her in February 2013. She has completed her preoperative evaluation and has been approved for laparoscopic Roux-en-Y gastric bypass. The patient denies any new medical problems or surgeries. She denies any new medications. She denies any fever, chills, nausea, vomiting, diarrhea or constipation. She denies any trips to the emergency room or the hospital.   Past Medical History  Diagnosis Date  . Irritable bowel syndrome   . Anemia   . Panic attack   . OCD (obsessive compulsive disorder)   . GERD (gastroesophageal reflux disease)     Past Surgical History  Procedure Date  . Myomectomy   . Endometrial ablation   . Tubal ligation 01/27/2002    Family History  Problem Relation Age of Onset  . Heart disease Mother   . Diabetes Mother   . Diabetes Sister   . Cancer Paternal Grandmother     colon    Social History History  Substance Use Topics  . Smoking status: Never Smoker   . Smokeless tobacco: Never Used  . Alcohol Use: No    Allergies  Allergen Reactions  . Prednisone Other (See Comments)    Psychological rxn: pt reports "losing time"    Current Outpatient Prescriptions  Medication Sig Dispense Refill  . clonazePAM (KLONOPIN) 0.5 MG tablet Take 0.5 mg by mouth 2 (two) times daily as needed. anxiety      . ergocalciferol (VITAMIN D2) 50000 UNITS capsule Take 50,000 Units by mouth once a week. wednesday      . FLUoxetine (PROZAC) 40 MG capsule Take 40 mg by mouth every morning.       . IRON PO Take 65 mg by mouth every other day.       . polyethylene glycol (GOLYTELY) 236 G solution Take 4,000 mLs by mouth once.  4000 mL  0  . zaleplon (SONATA) 10 MG capsule Take 10 mg by  mouth at bedtime as needed. sleep        Review of Systems Review of Systems  Constitutional: Negative for fever, diaphoresis, activity change and unexpected weight change.  HENT: Negative for hearing loss, nosebleeds and neck pain.   Eyes: Negative for photophobia and visual disturbance.  Respiratory: Negative for chest tightness, shortness of breath and wheezing.   Cardiovascular: Negative for chest pain, palpitations and leg swelling.  Gastrointestinal: Negative for nausea, vomiting, abdominal pain, diarrhea, constipation and abdominal distention.  Genitourinary: Negative for dysuria, flank pain and pelvic pain.  Musculoskeletal: Negative for back pain and gait problem.  Skin: Negative for color change and pallor.  Neurological: Negative for seizures, syncope, speech difficulty and light-headedness.  Hematological: Negative for adenopathy.  Psychiatric/Behavioral:       Some ocd    Blood pressure 134/82, pulse 72, temperature 97.4 F (36.3 C), temperature source Temporal, resp. rate 16, height 5' 3" (1.6 Nichols), weight 262 lb 2 oz (118.899 kg).  Physical Exam Physical Exam  Vitals reviewed. Constitutional: She is oriented to person, place, and time. She appears well-developed and well-nourished. No distress.  HENT:  Head: Normocephalic and atraumatic.  Right Ear: External ear normal.  Left Ear: External ear normal.  Eyes: Conjunctivae are normal. No   scleral icterus.  Neck: Normal range of motion. Neck supple. No tracheal deviation present. No thyromegaly present.  Cardiovascular: Normal rate, regular rhythm and normal heart sounds.   Pulmonary/Chest: Effort normal and breath sounds normal. No respiratory distress. She has no wheezes.  Abdominal: Soft. Bowel sounds are normal. She exhibits no distension. There is no tenderness. There is no rebound.  Musculoskeletal: Normal range of motion. She exhibits no edema and no tenderness.  Neurological: She is alert and oriented to person,  place, and time. She exhibits normal muscle tone.  Skin: Skin is warm and dry. No rash noted. No erythema.  Psychiatric: She has a normal mood and affect. Her behavior is normal. Judgment and thought content normal.    Data Reviewed My note from 01/2012 UGI - small rt sided hiatal hernia U/S - single gallstone Feb 2013 labs - some mild elevated TG; nml Hgb and Hct  Assessment    Morbid obesity BMI 46.4 Hypertriglyceridemia    Plan    We reviewed her upper gi, u/s, labs.   We discussed the importance of the preoperative diet and bowel prep.   We re-discussed laparoscopic Roux-en-Y gastric bypass. We discussed the preoperative, operative and postoperative process.  I explained the surgery in detail including the performance of an EGD near the end of the surgery and an Upper GI swallow study on POD 1. We discussed the typical hospital course including a 2-3 day stay baring any complications.   The patient was given educational material. I quoted the patient that they can expect to lose 50-70% of their excess weight with the gastric bypass. We did discuss the possibility of weight regain several years after the procedure.  We discussed that before and after surgery that there would be an alteration in their diet. I explained that we have put them on a diet 2 weeks before surgery. I also explained that they would be on a liquid diet for 2 weeks after surgery. We discussed that they would have to avoid certain foods such as sugar after surgery. We discussed the importance of physical activity as well as compliance with our dietary and supplement recommendations and routine follow-up.  All of her and her husband's questions were asked and answered.   LAPAROSCOPIC ROUX-EN-Y GASTRIC BYPASS this Monday.  Sandra Velarde Nichols. Kelton Bultman, MD, FACS General, Bariatric, & Minimally Invasive Surgery [facility] [place]         Sandra Nichols 04/13/2012, 2:37 PM    

## 2012-04-21 ENCOUNTER — Encounter (HOSPITAL_COMMUNITY): Payer: Self-pay | Admitting: General Surgery

## 2012-04-21 ENCOUNTER — Inpatient Hospital Stay (HOSPITAL_COMMUNITY): Payer: BC Managed Care – PPO

## 2012-04-21 LAB — DIFFERENTIAL
Eosinophils Relative: 0 % (ref 0–5)
Lymphocytes Relative: 11 % — ABNORMAL LOW (ref 12–46)
Lymphs Abs: 1.1 10*3/uL (ref 0.7–4.0)
Neutrophils Relative %: 84 % — ABNORMAL HIGH (ref 43–77)

## 2012-04-21 LAB — CBC
Hemoglobin: 11.7 g/dL — ABNORMAL LOW (ref 12.0–15.0)
MCV: 84.4 fL (ref 78.0–100.0)
Platelets: 312 10*3/uL (ref 150–400)
RBC: 4.17 MIL/uL (ref 3.87–5.11)
WBC: 9.9 10*3/uL (ref 4.0–10.5)

## 2012-04-21 LAB — HEMOGLOBIN AND HEMATOCRIT, BLOOD: Hemoglobin: 11.7 g/dL — ABNORMAL LOW (ref 12.0–15.0)

## 2012-04-21 MED ORDER — IOHEXOL 300 MG/ML  SOLN
50.0000 mL | Freq: Once | INTRAMUSCULAR | Status: AC | PRN
  Administered 2012-04-21: 50 mL via ORAL

## 2012-04-21 NOTE — Progress Notes (Signed)
Pt alert and oriented; pleasant disposition; VSS; c/o nausea last night but none this am; denies vomiting; remains NPO at present time; for UGI this am; c/o abdominal soreness with relief from prn pain meds; denies flatus or BM; voiding without difficulty; ambulating in hallways without difficutly; using Incentive spirometer; pt aware of BELT program and support group; pt already has follow up appts with [facility] and [facility]; discharge instructions given for pt to review; continue current plan of care. GASTRIC BYPASS/SLEEVE DISCHARGE INSTRUCTIONS  Drs. Fredrik Rigger, Hoxworth, Wilson, and Butler Call if you have any problems.   Call [phone] and ask for the surgeon on call.    If you need immediate assistance come to the ER at [facility]. Tell the ER personnel that you are a new post-op gastric bypass patient. Signs and symptoms to report:   Severe vomiting or nausea. If you cannot tolerate clear liquids for longer than 1 day, you need to call your surgeon.    Abdominal pain which does not get better after taking your pain medication   Fever greater than 101 F degree   Difficulty breathing   Chest pain    Redness, swelling, drainage, or foul odor at incision sites    If your incisions open or pull apart   Swelling or pain in calf (lower leg)   Diarrhea, frequent watery, uncontrolled bowel movements.   Constipation, (no bowel movements for 3 days) if this occurs, Take Milk of Magnesia, 2 tablespoons by mouth, 3 times a day for 2 days if needed.  Call your doctor if constipation continues. Stop taking Milk of Magnesia once you have had a bowel movement. You may also use Miralax according to the label instructions.   Anything you consider "abnormal for you".   Normal side effects after Surgery:   Unable to sleep at night or concentrate   Irritability   Being tearful (crying) or depressed   These are common complaints, possibly related to your anesthesia, stress of surgery and change in lifestyle,  that usually go away a few weeks after surgery.  If these feelings continue, call your medical doctor.  Wound Care You may have surgical glue, steri-strips, or staples over your incisions after surgery.  Surgical glue:  Looks like a clear film over your incisions and will wear off gradually. Steri-strips: Strips of tape over your incisions. You may notice a yellowish color on the skin underneath the steri-strips. This is a substance used to make the steri-strips stick better. Do not pull the steri-strips off - let them fall off.  Staples: Cherlynn Polo may be removed before you leave the hospital. If you go home with staples, call [facility] [phone]) for an appointment with your surgeon's nurse to have staples removed in 7 - 10 days. Showering: You may shower two days after your surgery unless otherwise instructed by your surgeon. Wash gently around wounds with warm soapy water, rinse well, and gently pat dry.  If you have a drain, you may need someone to hold this while you shower. Avoid tub baths until staples are removed and incisions are healed.    Medications   Medications should be liquid or crushed if larger than the size of a dime.  Extended release pills should not be crushed.   Depending on the size and number of medications you take, you may need to stagger/change the time you take your medications so that you do not over-fill your pouch.    Make sure you follow-up with your  primary care physician to make medication adjustments needed during rapid weight loss and life-style adjustment.   If you are diabetic, follow up with the doctor that prescribes your diabetes medication(s) within one week after surgery and check your blood sugar regularly.   Do not drive while taking narcotics!   Do not take acetaminophen (Tylenol) and Roxicet or Lortab Elixir at the same time since these pain medications contain acetaminophen.  Diet at home: (First 2 Weeks) You will see the  nutritionist two weeks after your surgery. She will advance your diet if you are tolerating liquids well. Once at home, if you have severe vomiting or nausea and cannot tolerate clear liquids lasting longer than 1 day, call your surgeon.  Begin high protein shake 2 ounces every 3 hours, 5 - 6 times per day.  Gradually increase the amount you drink as tolerated.  You may find it easier to slowly sip shakes throughout the day.  It is important to get your proteins in first.   Protein Shake   Drink at least 2 ounces of shake 5-6 times per day   Each serving of protein shakes should have a minimum of 15 grams of protein and no more than 5 grams of carbohydrate    Increase the amount of protein shake you drink as tolerated   Protein powder may be added to fluids such as non-fat milk or Lactaid milk (limit to 20 grams added protein powder per serving   The initial goal is to drink at least 8 ounces of protein shake/drink per day (or as directed by the nutritionist). Some examples of protein shakes are [employer], Dillard's, EAS Edge HP, and Unjury. Hydration   Gradually increase the amount of water and other liquids as tolerated (See Acceptable Fluids)   Gradually increase the amount of protein shake as tolerated     Sip fluids slowly and throughout the day   May use Sugar substitutes, use sparingly (limit to 6 - 8 packets per day). Your fluid goal is 64 ounces of fluid daily. It may take a few weeks to build up to this.         32 oz (or more) should be clear liquids and 32 oz (or more) should be full liquids.         Liquids should not contain sugar, caffeine, or carbonation! Acceptable Fluids Clear Liquids:   Water or Sugar-free flavored water, Fruit H2O   Decaffeinated coffee or tea (sugar-free)   Crystal Lite, Wyler's Lite, Minute Maid Lite   Sugar-free Jell-O   Bouillon or broth   Sugar-free Popsicle:   *Less than 20 calories each; Limit 1 per day   Full Liquids:               Protein Shakes/Drinks + 2 choices per day of other full liquids shown below.    Other full liquids must be: No more than 12 grams of Carbs per serving,  No more than 3 grams of Fat per serving   Strained low-fat cream soup   Non-Fat milk   Fat-free Lactaid Milk   Sugar-free yogurt (Dannon Lite & Fit) Vitamins and Minerals (Start 1 day after surgery unless otherwise directed)   2 Chewable Multivitamin / Multimineral Supplement (i.e. Centrum for Adults)   Chewable Calcium Citrate with Vitamin D-3. Take 1500 mg each day.           (Example: 3 Chewable Calcium Plus 600 with Vitamin D-3 can be found at [facility])  Vitamin B-12, 350 - 500 micrograms (oral tablet) each day   Do not mix multivitamins containing iron with calcium supplements; take 2 hours   apart   Do not substitute Tums (calcium carbonate) for your calcium   Menstruating women and those at risk for anemia may need extra iron. Talk with your doctor to see if you need additional iron.    If you need extra iron:  Total daily Iron recommendations (including Vitamins) = 50 - 100 mg Iron/day Do not stop taking or change any vitamins or minerals until you talk to your nutritionist or surgeon. Your nutritionist and / or physician must approve all vitamin and mineral supplements. Exercise For maximum success, begin exercising as soon as your doctor recommends. Make sure your physician approves any physical activity.   Depending on fitness level, begin with a simple walking program   Walk 5-15 minutes each day, 7 days per week.    Slowly increase until you are walking 30-45 minutes per day   Consider joining our BELT program. [phone] or email [email or web address] Things to remember:    You may have sexual relations when you feel comfortable. It is VERY important for female patients to use a reliable birth control method. Fertility often increases after surgery. Do not get pregnant for at least 18 months.   It is very important to keep all  follow up appointments with your surgeon, nutritionist, primary care physician, and behavioral health practitioner. After the first year, please follow up with your bariatric surgeon at least once a year in order to maintain best weight loss results.  [facility]: [phone] [facility]: [phone]   Free counseling is available for you and your family through collaboration between [facility] and [place]. Please call [phone] and leave a message.    Consider purchasing a medical alert bracelet that says you had gastric bypass surgery.    The [facility] has a free Bariatric Surgery Support Group that meets monthly, the 3rd Thursday, 6 pm, Classroom #1, [employer]. You may register online at [email or web address], but registration is not necessary. Select Classes and Support Groups, Bariatric Surgery, or Call [phone]   Do not return to work or drive until cleared by your surgeon   Use your CPAP when sleeping if applicable   Do not lift anything greater than ten pounds for at least two weeks  Talmadge Chad, RN Bariatric Nurse Coordinator

## 2012-04-21 NOTE — Progress Notes (Signed)
UGI reviewed. No leak. No evidence of anastomotic narrowing at G-j. Pt doing well with ice chips and sips of water. No fever. No tachycardia. Will advance to bariatric shake this evening.   Mary Sella. Andrey Campanile, MD, FACS General, Bariatric, & Minimally Invasive Surgery [facility], [place]

## 2012-04-21 NOTE — Progress Notes (Signed)
UR complete 

## 2012-04-21 NOTE — Progress Notes (Signed)
UGI results called to Dr. Andrey Campanile; orders received to advance to POD #1 diet; Rosaland Lao, RN aware of  Results and orders. Talmadge Chad, RN Bariatric Nurse Coordinator

## 2012-04-21 NOTE — Progress Notes (Signed)
1 Day Post-Op  Subjective: A little more sore today. Some mild nausea. No SOB. Walked several times last evening  Objective: Vital signs in last 24 hours: Temp:  [97.8 F (36.6 C)-98.7 F (37.1 C)] 98.1 F (36.7 C) (04/30 0650) Pulse Rate:  [66-100] 66  (04/30 0650) Resp:  [15-18] 18  (04/30 0650) BP: (124-165)/(70-97) 130/70 mmHg (04/30 0650) SpO2:  [92 %-100 %] 96 % (04/30 0650) Weight:  [262 lb 5.2 oz (118.99 kg)] 262 lb 5.2 oz (118.99 kg) (04/29 1157)    Intake/Output from previous day: 04/29 0701 - 04/30 0700 In: 3677.1 [I.V.:3677.1] Out: 3475 [Urine:3475] Intake/Output this shift:    Asleep, easily arousable.smiling CTA b/l Reg Obese, soft, expected mild TTP. Dressing C/D/I. +bs No edema; +SCDS  Lab Results:   Basename 04/21/12 0422 04/20/12 1218  WBC 9.9 --  HGB 11.7* 12.2  HCT 35.2* 36.2  PLT 312 --   BMET No results found for this basename: NA:2,K:2,CL:2,CO2:2,GLUCOSE:2,BUN:2,CREATININE:2,CALCIUM:2 in the last 72 hours PT/INR No results found for this basename: LABPROT:2,INR:2 in the last 72 hours ABG No results found for this basename: PHART:2,PCO2:2,PO2:2,HCO3:2 in the last 72 hours  Studies/Results: No results found.  Anti-infectives: Anti-infectives     Start     Dose/Rate Route Frequency Ordered Stop   04/20/12 0600   cefOXitin (MEFOXIN) 2 g in dextrose 5 % 50 mL IVPB        2 g 100 mL/hr over 30 Minutes Intravenous 60 min pre-op 04/20/12 0551 04/20/12 0734          Assessment/Plan: s/p Procedure(s) (LRB): LAPAROSCOPIC ROUX-EN-Y GASTRIC BYPASS WITH UPPER ENDOSCOPY ()  Vitals look great - no fever, no tachycardia. Keep NPO for now pending results of swallow Gastrogaffin swallow this am Cont subcu heparin for DVT prophlaxis Hgb stable.  Ambulate, pulm toilet  Mary Sella. Andrey Campanile, MD, FACS General, Bariatric, & Minimally Invasive Surgery [facility], [place]    LOS: 1 day    Atilano Ina 04/21/2012

## 2012-04-22 LAB — DIFFERENTIAL
Basophils Absolute: 0 10*3/uL (ref 0.0–0.1)
Basophils Relative: 0 % (ref 0–1)
Eosinophils Relative: 0 % (ref 0–5)
Monocytes Absolute: 0.5 10*3/uL (ref 0.1–1.0)
Monocytes Relative: 5 % (ref 3–12)
Neutro Abs: 6.5 10*3/uL (ref 1.7–7.7)

## 2012-04-22 LAB — CBC
HCT: 37.5 % (ref 36.0–46.0)
MCH: 28.2 pg (ref 26.0–34.0)
MCHC: 33.1 g/dL (ref 30.0–36.0)
MCV: 85.2 fL (ref 78.0–100.0)
Platelets: 348 10*3/uL (ref 150–400)
RDW: 13.3 % (ref 11.5–15.5)
WBC: 9.3 10*3/uL (ref 4.0–10.5)

## 2012-04-22 MED ORDER — OXYCODONE-ACETAMINOPHEN 5-325 MG/5ML PO SOLN: 5.0000 mL | ORAL | Status: DC | PRN

## 2012-04-22 NOTE — Progress Notes (Signed)
Pt alert and oriented; VSS; c/o bout of nausea this am while in the bathroom but has subsided; denies vomiting; tolerating protein shakes; + flatus; +BM; voiding without difficulty; ambulating in hallways without difficulty; c/o abdominal soreness with relief from prn pain meds; pt already has follow up appts with [facility] and [facility]; pt aware of BELT program and support group; discharge instructions reviewed and pt verbalized understanding of. GASTRIC BYPASS/SLEEVE DISCHARGE INSTRUCTIONS  Drs. Fredrik Rigger, Hoxworth, Wilson, and [place] Call if you have any problems.   Call [phone] and ask for the surgeon on call.    If you need immediate assistance come to the ER at [facility]. Tell the ER personnel that you are a new post-op gastric bypass patient. Signs and symptoms to report:   Severe vomiting or nausea. If you cannot tolerate clear liquids for longer than 1 day, you need to call your surgeon.    Abdominal pain which does not get better after taking your pain medication   Fever greater than 101 F degree   Difficulty breathing   Chest pain    Redness, swelling, drainage, or foul odor at incision sites    If your incisions open or pull apart   Swelling or pain in calf (lower leg)   Diarrhea, frequent watery, uncontrolled bowel movements.   Constipation, (no bowel movements for 3 days) if this occurs, Take Milk of Magnesia, 2 tablespoons by mouth, 3 times a day for 2 days if needed.  Call your doctor if constipation continues. Stop taking Milk of Magnesia once you have had a bowel movement. You may also use Miralax according to the label instructions.   Anything you consider "abnormal for you".   Normal side effects after Surgery:   Unable to sleep at night or concentrate   Irritability   Being tearful (crying) or depressed   These are common complaints, possibly related to your anesthesia, stress of surgery and change in lifestyle, that usually go away a few weeks after surgery.  If these  feelings continue, call your medical doctor.  Wound Care You may have surgical glue, steri-strips, or staples over your incisions after surgery.  Surgical glue:  Looks like a clear film over your incisions and will wear off gradually. Steri-strips: Strips of tape over your incisions. You may notice a yellowish color on the skin underneath the steri-strips. This is a substance used to make the steri-strips stick better. Do not pull the steri-strips off - let them fall off.  Staples: Cherlynn Polo may be removed before you leave the hospital. If you go home with staples, call [facility] [phone]) for an appointment with your surgeon's nurse to have staples removed in 7 - 10 days. Showering: You may shower two days after your surgery unless otherwise instructed by your surgeon. Wash gently around wounds with warm soapy water, rinse well, and gently pat dry.  If you have a drain, you may need someone to hold this while you shower. Avoid tub baths until staples are removed and incisions are healed.    Medications   Medications should be liquid or crushed if larger than the size of a dime.  Extended release pills should not be crushed.   Depending on the size and number of medications you take, you may need to stagger/change the time you take your medications so that you do not over-fill your pouch.    Make sure you follow-up with your primary care physician to make medication adjustments needed during rapid weight  loss and life-style adjustment.   If you are diabetic, follow up with the doctor that prescribes your diabetes medication(s) within one week after surgery and check your blood sugar regularly.   Do not drive while taking narcotics!   Do not take acetaminophen (Tylenol) and Roxicet or Lortab Elixir at the same time since these pain medications contain acetaminophen.  Diet at home: (First 2 Weeks) You will see the nutritionist two weeks after your surgery. She will advance your  diet if you are tolerating liquids well. Once at home, if you have severe vomiting or nausea and cannot tolerate clear liquids lasting longer than 1 day, call your surgeon.  Begin high protein shake 2 ounces every 3 hours, 5 - 6 times per day.  Gradually increase the amount you drink as tolerated.  You may find it easier to slowly sip shakes throughout the day.  It is important to get your proteins in first.   Protein Shake   Drink at least 2 ounces of shake 5-6 times per day   Each serving of protein shakes should have a minimum of 15 grams of protein and no more than 5 grams of carbohydrate    Increase the amount of protein shake you drink as tolerated   Protein powder may be added to fluids such as non-fat milk or Lactaid milk (limit to 20 grams added protein powder per serving   The initial goal is to drink at least 8 ounces of protein shake/drink per day (or as directed by the nutritionist). Some examples of protein shakes are [employer], [employer]illard's, EAS Edge HP, and Unjury. Hydration   Gradually increase the amount of water and other liquids as tolerated (See Acceptable Fluids)   Gradually increase the amount of protein shake as tolerated     Sip fluids slowly and throughout the day   May use Sugar substitutes, use sparingly (limit to 6 - 8 packets per day). Your fluid goal is 64 ounces of fluid daily. It may take a few weeks to build up to this.         32 oz (or more) should be clear liquids and 32 oz (or more) should be full liquids.         Liquids should not contain sugar, caffeine, or carbonation! Acceptable Fluids Clear Liquids:   Water or Sugar-free flavored water, Fruit H2O   Decaffeinated coffee or tea (sugar-free)   Crystal Lite, Wyler's Lite, Minute Maid Lite   Sugar-free Jell-O   Bouillon or broth   Sugar-free Popsicle:   *Less than 20 calories each; Limit 1 per day   Full Liquids:              Protein Shakes/Drinks + 2 choices per day of other full liquids  shown below.    Other full liquids must be: No more than 12 grams of Carbs per serving,  No more than 3 grams of Fat per serving   Strained low-fat cream soup   Non-Fat milk   Fat-free Lactaid Milk   Sugar-free yogurt (Dannon Lite & Fit) Vitamins and Minerals (Start 1 day after surgery unless otherwise directed)   2 Chewable Multivitamin / Multimineral Supplement (i.e. Centrum for Adults)   Chewable Calcium Citrate with Vitamin D-3. Take 1500 mg each day.           (Example: 3 Chewable Calcium Plus 600 with Vitamin D-3 can be found at [facility])         Vitamin B-12, 350 -  500 micrograms (oral tablet) each day   Do not mix multivitamins containing iron with calcium supplements; take 2 hours   apart   Do not substitute Tums (calcium carbonate) for your calcium   Menstruating women and those at risk for anemia may need extra iron. Talk with your doctor to see if you need additional iron.    If you need extra iron:  Total daily Iron recommendations (including Vitamins) = 50 - 100 mg Iron/day Do not stop taking or change any vitamins or minerals until you talk to your nutritionist or surgeon. Your nutritionist and / or physician must approve all vitamin and mineral supplements. Exercise For maximum success, begin exercising as soon as your doctor recommends. Make sure your physician approves any physical activity.   Depending on fitness level, begin with a simple walking program   Walk 5-15 minutes each day, 7 days per week.    Slowly increase until you are walking 30-45 minutes per day   Consider joining our BELT program. [phone] or email [email or web address] Things to remember:    You may have sexual relations when you feel comfortable. It is VERY important for female patients to use a reliable birth control method. Fertility often increases after surgery. Do not get pregnant for at least 18 months.   It is very important to keep all follow up appointments with your surgeon, nutritionist, primary  care physician, and behavioral health practitioner. After the first year, please follow up with your bariatric surgeon at least once a year in order to maintain best weight loss results.  [facility]: [phone] [facility]: [phone]   Free counseling is available for you and your family through collaboration between [facility] and [place]. Please call [phone] and leave a message.    Consider purchasing a medical alert bracelet that says you had gastric bypass surgery.    The [facility] has a free Bariatric Surgery Support Group that meets monthly, the 3rd Thursday, 6 pm, Classroom #1, [employer]. You may register online at [email or web address], but registration is not necessary. Select Classes and Support Groups, Bariatric Surgery, or Call [phone]   Do not return to work or drive until cleared by your surgeon   Use your CPAP when sleeping if applicable   Do not lift anything greater than ten pounds for at least two weeks  Lamarr Lulas Bariatric Nurse Coordinator

## 2012-04-22 NOTE — Discharge Summary (Signed)
Physician Discharge Summary  Patient ID: Sandra Nichols MRN: [id number] DOB/AGE: February 23, 1976 36 y.o.  Admit date: 04/20/2012 Discharge date: 04/22/2012  Admission Diagnoses: Morbid obesity hypertriglyceridemia  Discharge Diagnoses:  Active Problems:  Obesity, Class III, BMI 40-49.9 (morbid obesity)  s/p Lap Roux-en-Y gastric bypass hypertriglyceridemia  Discharged Condition: good  Hospital Course: The patient underwent planned Laparoscopic Roux-en-Y gastric bypass surgery on Monday, April 29. Her postoperative course was unremarkable. She was maintained on perioperative subcutaneous heparin for DVT prophylaxis. On POD 1, she underwent a Upper GI which demonstrated no signs of leak and the contrast emptied into her roux limb. She was intially started on water then advanced to protein shakes. She was ambulating in the halls multiple times throughout the day. She was tolerating her protein shakes on POD 2. She was deemed stable for discharge  Consults: None  Significant Diagnostic Studies: labs: Hgb 12, HCT 37.5 and radiology: Upper GI - see results above  Treatments: IV hydration, analgesia: acetaminophen, Morphine and roxicet elixir and surgery: LAPAROSCOPIC ROUX-EN-Y GASTRIC BYPASS AND EGD  Discharge Exam: Blood pressure 139/81, pulse 78, temperature 98 F (36.7 C), temperature source Oral, resp. rate 20, height 5\' 3"  (1.6 m), weight 262 lb 5.2 oz (118.99 kg), SpO2 97.00%. BP 139/81  Pulse 78  Temp(Src) 98 F (36.7 C) (Oral)  Resp 20  Ht 5\' 3"  (1.6 m)  Wt 262 lb 5.2 oz (118.99 kg)  BMI 46.47 kg/m2  SpO2 97%  Gen: alert, NAD, non-toxic appearing Pupils: equal, no scleral icterus Pulm: Lungs clear to auscultation, symmetric chest rise CV: regular rate and rhythm Abd: soft, min tender, nondistended, obese. No cellulitis. Dressings c/d/i Ext: no edema, no calf tenderness Skin: no rash, no jaundice   Disposition:   Discharge Orders    Future Appointments: Provider: Department:  Dept Phone: Center:   05/05/2012 4:00 PM [facility] Post-Op Class [facility] [phone] NDM   05/14/2012 11:30 AM Atilano Ina, MD,FACS [facility] [phone] None     Future Orders Please Complete By Expires   Increase activity slowly      Discharge instructions      Comments:   Read instructions provided by bariatric nurse     Medication List  As of 04/22/2012  1:42 PM   STOP taking these medications         IRON PO         TAKE these medications         clonazePAM 0.5 MG tablet   Commonly known as: KLONOPIN   Take 0.5 mg by mouth 2 (two) times daily as needed. anxiety      ergocalciferol 50000 UNITS capsule   Commonly known as: VITAMIN D2   Take 50,000 Units by mouth once a week. wednesday      FLUoxetine 40 MG capsule   Commonly known as: PROZAC   Take 40 mg by mouth every morning.      oxyCODONE-acetaminophen 5-325 MG/5ML solution   Commonly known as: ROXICET   Take 5-10 mLs by mouth every 4 (four) hours as needed.      zaleplon 10 MG capsule   Commonly known as: SONATA   Take 10 mg by mouth at bedtime as needed. sleep           Follow-up Information    Follow up with Atilano Ina, MD,FACS on 05/14/2012. (11:30 am)    Contact information:   [facility] [address], [address] [place] [address] [phone]  Mary Sella. Andrey Campanile, MD, FACS General, Bariatric, & Minimally Invasive Surgery [facility], [place]   Signed: Atilano Ina 04/22/2012, 1:42 PM

## 2012-04-22 NOTE — Discharge Instructions (Signed)
GASTRIC BYPASS/SLEEVE DISCHARGE INSTRUCTIONS  Drs. Fredrik Rigger, [place], Keonta Alsip, and [place]  Call if you have any problems.  Call [phone] and ask for the surgeon on call.  If you need immediate assistance come to the ER at [facility]. Tell the ER personnel that you are a new post-op gastric bypass patient. Signs and symptoms to report:  Severe vomiting or nausea. If you cannot tolerate clear liquids for longer than 1 day, you need to call your surgeon.  Abdominal pain which does not get better after taking your pain medication  Fever greater than 101 F degree  Difficulty breathing  Chest pain  Redness, swelling, drainage, or foul odor at incision sites  If your incisions open or pull apart  Swelling or pain in calf (lower leg)  Diarrhea, frequent watery, uncontrolled bowel movements.  Constipation, (no bowel movements for 3 days) if this occurs, Take Milk of Magnesia, 2 tablespoons by mouth, 3 times a day for 2 days if needed. Call your doctor if constipation continues. Stop taking Milk of Magnesia once you have had a bowel movement. You may also use Miralax according to the label instructions.  Anything you consider "abnormal for you". Normal side effects after Surgery:  Unable to sleep at night or concentrate  Irritability  Being tearful (crying) or depressed These are common complaints, possibly related to your anesthesia, stress of surgery and change in lifestyle, that usually go away a few weeks after surgery. If these feelings continue, call your medical doctor.  Wound Care  You may have surgical glue, steri-strips, or staples over your incisions after surgery.  Surgical glue: Looks like a clear film over your incisions and will wear off gradually.  Steri-strips: Strips of tape over your incisions. You may notice a yellowish color on the skin underneath the steri-strips. This is a substance used to make the steri-strips stick better. Do not pull the steri-strips off - let  them fall off.  Staples: Cherlynn Polo may be removed before you leave the hospital. If you go home with staples, call [facility] [phone]) for an appointment with your surgeon's nurse to have staples removed in 7 - 10 days.  Showering: You may shower two days after your surgery unless otherwise instructed by your surgeon. Wash gently around wounds with warm soapy water, rinse well, and gently pat dry. If you have a drain, you may need someone to hold this while you shower. Avoid tub baths until staples are removed and incisions are healed.  Medications  Medications should be liquid or crushed if larger than the size of a dime. Extended release pills should not be crushed.  Depending on the size and number of medications you take, you may need to stagger/change the time you take your medications so that you do not over-fill your pouch.  Make sure you follow-up with your primary care physician to make medication adjustments needed during rapid weight loss and life-style adjustment.  If you are diabetic, follow up with the doctor that prescribes your diabetes medication(s) within one week after surgery and check your blood sugar regularly.  Do not drive while taking narcotics!  Do not take acetaminophen (Tylenol) and Roxicet or Lortab Elixir at the same time since these pain medications contain acetaminophen. Diet at home: (First 2 Weeks)  You will see the nutritionist two weeks after your surgery. She will advance your diet if you are tolerating liquids well.  Once at home, if you have severe vomiting or nausea and cannot tolerate  clear liquids lasting longer than 1 day, call your surgeon.  Begin high protein shake 2 ounces every 3 hours, 5 - 6 times per day. Gradually increase the amount you drink as tolerated. You may find it easier to slowly sip shakes throughout the day. It is important to get your proteins in first.  Protein Shake  Drink at least 2 ounces of shake 5-6 times per day   Each serving of protein shakes should have a minimum of 15 grams of protein and no more than 5 grams of carbohydrate  Increase the amount of protein shake you drink as tolerated  Protein powder may be added to fluids such as non-fat milk or Lactaid milk (limit to 20 grams added protein powder per serving  The initial goal is to drink at least 8 ounces of protein shake/drink per day (or as directed by the nutritionist). Some examples of protein shakes are [employer], Dillard's, EAS Edge HP, and Unjury. Hydration  Gradually increase the amount of water and other liquids as tolerated (See Acceptable Fluids)  Gradually increase the amount of protein shake as tolerated  Sip fluids slowly and throughout the day  May use Sugar substitutes, use sparingly (limit to 6 - 8 packets per day). Your fluid goal is 64 ounces of fluid daily. It may take a few weeks to build up to this.  32 oz (or more) should be clear liquids and 32 oz (or more) should be full liquids.  Liquids should not contain sugar, caffeine, or carbonation!  Acceptable Fluids  Clear Liquids:  Water or Sugar-free flavored water, Fruit H2O  Decaffeinated coffee or tea (sugar-free)  Crystal Lite, Wyler's Lite, Minute Maid Lite  Sugar-free Jell-O  Bouillon or broth  Sugar-free Popsicle: *Less than 20 calories each; Limit 1 per day Full Liquids:  Protein Shakes/Drinks + 2 choices per day of other full liquids shown below.  Other full liquids must be: No more than 12 grams of Carbs per serving,  No more than 3 grams of Fat per serving  Strained low-fat cream soup  Non-Fat milk  Fat-free Lactaid Milk  Sugar-free yogurt (Dannon Lite & Fit) Vitamins and Minerals (Start 1 day after surgery unless otherwise directed)  2 Chewable Multivitamin / Multimineral Supplement (i.e. Centrum for Adults)  Chewable Calcium Citrate with Vitamin D-3. Take 1500 mg each day. (Example: 3 Chewable Calcium Plus 600 with Vitamin D-3 can be found at  [facility])  Vitamin B-12, 350 - 500 micrograms (oral tablet) each day Do not mix multivitamins containing iron with calcium supplements; take 2 hours apart  Do not substitute Tums (calcium carbonate) for your calcium  Menstruating women and those at risk for anemia may need extra iron. Talk with your doctor to see if you need additional iron.  If you need extra iron:  Total daily Iron recommendations (including Vitamins) = 50 - 100 mg Iron/day  Do not stop taking or change any vitamins or minerals until you talk to your nutritionist or surgeon. Your nutritionist and / or physician must approve all vitamin and mineral supplements.  Exercise  For maximum success, begin exercising as soon as your doctor recommends. Make sure your physician approves any physical activity.  Depending on fitness level, begin with a simple walking program  Walk 5-15 minutes each day, 7 days per week.  Slowly increase until you are walking 30-45 minutes per day  Consider joining our BELT program. [phone] or email belt@[employer] .edu Things to remember:  You may have sexual relations  when you feel comfortable. It is VERY important for female patients to use a reliable birth control method. Fertility often increases after surgery. Do not get pregnant for at least 18 months. It is very important to keep all follow up appointments with your surgeon, nutritionist, primary care physician, and behavioral health practitioner. After the first year, please follow up with your bariatric surgeon at least once a year in order to maintain best weight loss results.  [facility]: [phone]  [facility]: [phone]  Free counseling is available for you and your family through collaboration between [facility] and [place]. Please call [phone] and leave a message.  Consider purchasing a medical alert bracelet that says you had gastric bypass surgery.  The [facility]  has a free Bariatric Surgery Support Group that meets monthly, the 3rd Thursday, 6 pm, Classroom #1, [place]. You may register online at [email or web address], but registration is not necessary. Select Classes and Support Groups, Bariatric Surgery, or Call [phone]  Do not return to work or drive until cleared by your surgeon  Use your CPAP when sleeping if applicable  Do not lift anything greater than ten pounds for at least two weeks

## 2012-05-05 ENCOUNTER — Encounter: Payer: BC Managed Care – PPO | Attending: General Surgery | Admitting: *Deleted

## 2012-05-05 ENCOUNTER — Encounter: Payer: Self-pay | Admitting: *Deleted

## 2012-05-05 VITALS — Ht 63.0 in | Wt 247.5 lb

## 2012-05-05 DIAGNOSIS — Z01818 Encounter for other preprocedural examination: Secondary | ICD-10-CM | POA: Insufficient documentation

## 2012-05-05 DIAGNOSIS — Z713 Dietary counseling and surveillance: Secondary | ICD-10-CM | POA: Insufficient documentation

## 2012-05-05 DIAGNOSIS — Z9884 Bariatric surgery status: Secondary | ICD-10-CM

## 2012-05-05 NOTE — Progress Notes (Signed)
  Bariatric Surgery Post-Op Education:  Appt start time: 1600   End time:  1700.  2 Week Post-Operative Nutrition Class  Patient was seen on 05/05/2012 for Post-Operative [facility].   Surgery date: 04/13/12 Surgery type: RYGB Start weight at [facility]: 262.3 lbs (02/24/12)  Weight today: 247.5 lbs Weight change: - 14.8 lbs Total weight lost: 14.8 lbs BMI: 43.8 kg/m^2   TANITA  BODY COMP RESULTS  02/24/12 05/05/12  %Fat 54.3 52.2%  FM (lbs) 141.5 129.0  FFM (lbs) 119.5 118.5  TBW (lbs) 87.5 87.0   The following the learning objectives were met by the patient during this course:   Identifies Phase 3A (Soft, High Proteins) Dietary Goals and will begin from 2 weeks post-operatively to 2 months post-operatively  Identifies appropriate sources of fluids and proteins   States protein recommendations and appropriate sources post-operatively  Identifies the need for appropriate texture modifications, mastication, and bite sizes when consuming solids  Identifies appropriate multivitamin and calcium sources post-operatively  Describes the need for physical activity post-operatively and will follow MD recommendations  States when to call healthcare provider regarding medication questions or post-operative complications  Handouts given during class include:  Phase 3A: Soft, High Protein Diet Handout  Follow-Up Plan: Patient will follow-up at [facility] in 6 weeks for 2 months post-op nutrition visit for diet advancement per MD.

## 2012-05-05 NOTE — Patient Instructions (Signed)
Patient to follow Phase 3A-Soft, High Protein Diet and follow-up at [facility] in 6 weeks for 2 months post-op nutrition visit for diet advancement. 

## 2012-05-11 ENCOUNTER — Telehealth (INDEPENDENT_AMBULATORY_CARE_PROVIDER_SITE_OTHER): Payer: Self-pay | Admitting: General Surgery

## 2012-05-11 NOTE — Telephone Encounter (Signed)
Pt calling from work, requesting a RTW note.  She was told by Dr. Andrey Campanile she could return today (3 weeks) if she felt well, so she has returned.  Her postop appt is in 3 more days.  Pt states she has a sedentary job with little physical demand, so is comfortable returning now.  She understands the RTW note will be issued at her office visit on 05/14/12.

## 2012-05-14 ENCOUNTER — Encounter (INDEPENDENT_AMBULATORY_CARE_PROVIDER_SITE_OTHER): Payer: Self-pay | Admitting: General Surgery

## 2012-05-14 ENCOUNTER — Ambulatory Visit (INDEPENDENT_AMBULATORY_CARE_PROVIDER_SITE_OTHER): Payer: BC Managed Care – PPO | Admitting: General Surgery

## 2012-05-14 VITALS — BP 144/90 | HR 72 | Temp 97.8°F | Resp 16 | Ht 63.0 in | Wt 242.4 lb

## 2012-05-14 DIAGNOSIS — Z9884 Bariatric surgery status: Secondary | ICD-10-CM

## 2012-05-14 NOTE — Patient Instructions (Signed)
Keep up the great work with exercising. Try to doing some interval training that we talked about it. Incorporate all your supplements

## 2012-05-14 NOTE — Progress Notes (Signed)
Chief complaint: Postop  Procedure: Status post laparoscopic Roux-en-Y gastric bypass April 29  History of Present Ilness: 36 year old Caucasian female comes in today for her first postoperative appointment. She states that she has done really well since surgery. She denies any fever or chills. She denies any abdominal pain. She had one episode of dumping. She thinks that is because she used too much sauce with her lean [place] prep. She has had one or 2 episodes of nausea but none in the past 2 weeks. She was returned back to work. She is walking about 2 miles per day. She is taking all of her supplements except for her vitamin D and B12. She is having daily bowel movements.  Physical Exam: BP 144/90  Pulse 72  Temp(Src) 97.8 F (36.6 C) (Temporal)  Resp 16  Ht 5\' 3"  (1.6 m)  Wt 242 lb 6.4 oz (109.952 kg)  BMI 42.94 kg/m2  Gen: alert, NAD, non-toxic appearing Pupils: equal, no scleral icterus Pulm: Lungs clear to auscultation, symmetric chest rise CV: regular rate and rhythm Abd: soft, nontender, nondistended. Well-healed trocar sites. No cellulitis. No incisional hernia Ext: no edema, no calf tenderness Skin: no rash, no jaundice  Assessment and Plan: 36 year old Caucasian female status post laparoscopic Roux-en-Y gastric bypass-doing well.  She has lost 19.6 pounds at surgery. If you use her initial starting weight from her first office visit, her weight loss is 22.6 pounds. I congratulated her on her weight loss. I encouraged her to continue to exercise. I also encouraged her to incorporate interval training into her exercise. We also talked about the importance of taking all of her supplements. Followup 3 months  Mary Sella. Andrey Campanile, MD, FACS General, Bariatric, & Minimally Invasive Surgery [facility], [place]

## 2012-05-28 ENCOUNTER — Telehealth (INDEPENDENT_AMBULATORY_CARE_PROVIDER_SITE_OTHER): Payer: Self-pay

## 2012-05-28 NOTE — Telephone Encounter (Signed)
The patient called in and states she has a uti and she wants to make sure she can take antibiotics.  She is going to an urgent care.  I told her she can but to avoid NSAIDs.

## 2012-06-16 ENCOUNTER — Encounter: Payer: BC Managed Care – PPO | Attending: General Surgery | Admitting: *Deleted

## 2012-06-16 ENCOUNTER — Encounter: Payer: Self-pay | Admitting: *Deleted

## 2012-06-16 VITALS — Ht 63.0 in | Wt 228.5 lb

## 2012-06-16 DIAGNOSIS — Z01818 Encounter for other preprocedural examination: Secondary | ICD-10-CM | POA: Insufficient documentation

## 2012-06-16 DIAGNOSIS — Z713 Dietary counseling and surveillance: Secondary | ICD-10-CM | POA: Insufficient documentation

## 2012-06-16 NOTE — Patient Instructions (Addendum)
Goals:  Follow Phase 3B: High Protein + Non-Starchy Vegetables  Eat 3-6 small meals/snacks, every 3-5 hrs  Continue lean protein foods to meet 60-80g goal  Continue fluid intake of 64oz +  Avoid drinking 15 minutes before, during and 30 minutes after eating  Continue >30 min of physical activity daily

## 2012-06-16 NOTE — Progress Notes (Signed)
Follow-up visit:  8 Weeks Post-Operative RYGB Surgery  Medical Nutrition Therapy:  Appt start time: 0830   End time:  0900.  Primary concerns today: Post-operative Bariatric Surgery Nutrition Management. Sandra Nichols returns today for 2 month f/u with ~15 lb loss of fat mass; she is very pleased with this. Reports no issues other than stomach pain with MVI and severe diarrhea with sugar ETOH. Currently eating ~2 meals/day plus a snack d/t decreased appetite.   Surgery date: 04/13/12 Surgery type: RYGB Start weight at [facility]: 262.3 lbs (02/24/12)  Weight today: 228.5 lbs Weight change: 19.0 lbs Total weight lost: 33.8 lbs BMI: 40.5 kg/m^2   TANITA  BODY COMP RESULTS  02/24/12 05/05/12 06/16/12  %Fat 54.3 52.2% 49.6%  FM (lbs) 141.5 129.0 113.5  FFM (lbs) 119.5 118.5 115.0  TBW (lbs) 87.5 87.0 84.0   24-hr recall: B (AM): Protein shake (20 oz) - 20g  Snk (AM): None  L (PM): 3-4 oz chicken breast, small amt of zucchini and squash (25-30g) Snk (PM): [place] yogurt (12g) D (PM): Cheese stick (8g) Snk (PM): SF pudding  Fluid intake: 60-70 oz Estimated total protein intake: 65-75 g  Medications: See medication list Supplementation: Taking regularly; reports stomach pain with MVI. Gave new Celebrate MVI chewy to try in office. Tolerated well.   Using straws: 1 time at restaurant - caused gas Drinking while eating: No Hair loss: No Carbonated beverages: No N/V/D/C: Volatile diarrhea with SF candy d/t sugar ETOH  Dumping syndrome: Recent episode with SF ice cream  Recent physical activity:  Cardio 3 days/week; Strength training T/Th with personal trainer  Progress Towards Goal(s):  In progress.  Handouts given during visit include:  Phase 3B: High Protein + Non-Starchy Vegetables  Samples given during visit include:  Celebrate Vitamins  MVI (Pineapple-Strawberry): 1 ea - Lot # [id number]; Exp: 09/14  Iron + C 60 mg: 1 ea - Lot # [id number]; Exp: 03/14   Nutritional Diagnosis:  [place]-3.3  Overweight/obesity related to recent RYGB surgery as evidenced by patient following post-op nutritional guidelines for continued weight loss.    Intervention:  Nutrition education/reinforcement.  Monitoring/Evaluation:  Dietary intake, exercise, lap band fills, and body weight. Follow up in 1 month for 3 month post-op visit.

## 2012-07-14 ENCOUNTER — Encounter: Payer: Self-pay | Admitting: *Deleted

## 2012-07-14 ENCOUNTER — Encounter: Payer: BC Managed Care – PPO | Attending: General Surgery | Admitting: *Deleted

## 2012-07-14 VITALS — Ht 63.0 in | Wt 215.5 lb

## 2012-07-14 DIAGNOSIS — Z713 Dietary counseling and surveillance: Secondary | ICD-10-CM | POA: Insufficient documentation

## 2012-07-14 DIAGNOSIS — Z01818 Encounter for other preprocedural examination: Secondary | ICD-10-CM | POA: Insufficient documentation

## 2012-07-14 NOTE — Patient Instructions (Addendum)
Goals:  Follow Phase 3B: High Protein + Non-Starchy Vegetables  Eat 3-6 small meals/snacks, every 3-5 hrs  Continue intake of lean protein foods to meet 60-80g goal  Continue fluid intake of 64oz +  Avoid drinking 15 minutes before, during and 30 minutes after eating  Aim for >30 min of physical activity daily

## 2012-07-14 NOTE — Progress Notes (Signed)
Follow-up visit:  12 Weeks Post-Operative RYGB Surgery  Medical Nutrition Therapy:  Appt start time: 0900   End time:  0930.  Primary concerns today: Post-operative Bariatric Surgery Nutrition Management. Sandra Nichols returns today for 3 month f/u with total loss of 35 lb of fat mass since assessment. No problems reported.    Surgery date: 04/13/12 Surgery type: RYGB Start weight at [facility]: 262.3 lbs (02/24/12)  Weight today: 215.5 lbs Weight change: 13.0 lbs Total weight lost: 46.8 lbs BMI: 38.2 kg/m^2  Weight loss goal: 130 lbs % goal met: 35%   TANITA  BODY COMP RESULTS  02/24/12 05/05/12 06/16/12 07/14/12  %Fat 54.3 52.2% 49.6% 49.5%  FM (lbs) 141.5 129.0 113.5 106.5  FFM (lbs) 119.5 118.5 115.0 109.0  TBW (lbs) 87.5 87.0 84.0 80.0   24-hr recall: B (AM): Protein shake (20 oz) - 20g  Snk (AM): None or Cheese Stick  L (PM): 3-4 oz chicken breast, sometimes veggies (25-30g) Snk (PM): [place] yogurt (12g) D (PM): 15 small Shrimp  Snk (PM): SF pudding OR none  Fluid intake: 60-70 oz (Water, SF Green Tea) Estimated total protein intake: 65-75 g  Medications: See medication list Supplementation: Taking regularly; no more stomach pain reported.   Using straws: No Drinking while eating: No Hair loss: No Carbonated beverages: No N/V/D/C: None  Dumping syndrome: Recent episode with syrup on pancake  Recent physical activity:  Cardio 2 days/week; Zumba 1 days/week; Strength training T/Th with personal trainer  Samples given during visit include:  Celebrate Vitamins MVI (Pineapple-Strawberry): 3 ea Lot # [id number]; Exp: 09/14  Progress Towards Goal(s):  In progress.   Nutritional Diagnosis:  [place]-3.3 Overweight/obesity related to recent RYGB surgery as evidenced by patient following post-op nutritional guidelines for continued weight loss.    Intervention:  Nutrition education/reinforcement.  Monitoring/Evaluation:  Dietary intake, exercise, and body weight. Follow up in 3 month for 6 month  post-op visit.

## 2012-08-12 ENCOUNTER — Ambulatory Visit (INDEPENDENT_AMBULATORY_CARE_PROVIDER_SITE_OTHER): Payer: BC Managed Care – PPO | Admitting: General Surgery

## 2012-08-12 ENCOUNTER — Encounter (INDEPENDENT_AMBULATORY_CARE_PROVIDER_SITE_OTHER): Payer: Self-pay | Admitting: General Surgery

## 2012-08-12 VITALS — BP 116/80 | HR 60 | Temp 97.0°F | Resp 16 | Ht 63.0 in | Wt 205.4 lb

## 2012-08-12 DIAGNOSIS — Z9884 Bariatric surgery status: Secondary | ICD-10-CM

## 2012-08-12 NOTE — Progress Notes (Signed)
Subjective:     Patient ID: Sandra Nichols, female   DOB: Jul 26, 1976, 36 y.o.   MRN: [id number]  HPI 36 year old Caucasian female comes in for long-term followup after undergoing laparoscopic Roux-en-Y gastric bypass on 04/20/2012. Her last office visit was on May 23. Since that time she states that she has been doing great. She denies any nausea, vomiting, heartburn, abdominal pain bloating. She is having 2-3 bowel movements per day. She will occasionally have some loose stools however that is very intermittent. She does report some mild hair thinning. However she reports getting 60-80 g of protein per day. She also started recently taking biotin. She is working out multiple times during the week. She is working out twice a week with a Systems analyst. She is doing Zumba twice a week. She is also walking 2-3 miles per night. She states that she feels more energetic. She reports that she is taking all of her supplements as directed. She recently switched brands of vitamins and reports a better compliance. She has recently started grad school for IT management.  PMHx, PSHx, SOCHx, FAMHx, ALL reviewed and unchanged  Review of Systems 10 point ROS performed - negative except for HPI    Objective:   Physical Exam BP 116/80  Pulse 60  Temp 97 F (36.1 C) (Temporal)  Resp 16  Ht 5\' 3"  (1.6 m)  Wt 205 lb 6.4 oz (93.169 kg)  BMI 36.39 kg/m2  preop wt: 262  Gen: alert, NAD, non-toxic appearing Pupils: equal, no scleral icterus Pulm: Lungs clear to auscultation, symmetric chest rise CV: regular rate and rhythm Abd: soft, nontender, nondistended. Well-healed trocar sites. No cellulitis. No incisional hernia Ext: no edema, no calf tenderness Skin: no rash, no jaundice     Assessment:     S/p Laparoscopic roux-en-y gastric bypass GERD - resolved Obesity    Plan:     She appears to be doing great. Her weight loss to date has been 56.6 pounds. Her BMI has gone from 42 to36. I congratulated her  on her weight loss.I encouraged her to continue to be active with golf or exercising that she's been doing. Also stressed the importance of continuing to be compliant with her supplement recommendations. Followup 3 months  Mary Sella. Andrey Campanile, MD, FACS General, Bariatric, & Minimally Invasive Surgery [facility], [place]

## 2012-08-12 NOTE — Patient Instructions (Signed)
Continue your excellent work and exercising!!

## 2012-10-15 ENCOUNTER — Ambulatory Visit: Payer: BC Managed Care – PPO | Admitting: *Deleted

## 2012-10-16 ENCOUNTER — Ambulatory Visit: Payer: BC Managed Care – PPO | Admitting: *Deleted

## 2012-11-06 ENCOUNTER — Encounter: Payer: Self-pay | Admitting: *Deleted

## 2012-11-06 ENCOUNTER — Encounter: Payer: No Typology Code available for payment source | Attending: General Surgery | Admitting: *Deleted

## 2012-11-06 VITALS — Ht 63.0 in | Wt 182.5 lb

## 2012-11-06 DIAGNOSIS — Z09 Encounter for follow-up examination after completed treatment for conditions other than malignant neoplasm: Secondary | ICD-10-CM | POA: Insufficient documentation

## 2012-11-06 DIAGNOSIS — Z713 Dietary counseling and surveillance: Secondary | ICD-10-CM | POA: Insufficient documentation

## 2012-11-06 DIAGNOSIS — Z9884 Bariatric surgery status: Secondary | ICD-10-CM | POA: Insufficient documentation

## 2012-11-06 NOTE — Progress Notes (Signed)
Follow-up visit:  6 Months Post-Operative RYGB Surgery  Medical Nutrition Therapy:  Appt start time: 0915   End time:  0945.  Primary concerns today: Post-operative Bariatric Surgery Nutrition Management. Sandra Nichols returns today for 6 month f/u. Doing extremely well and now wearing a size 14 pants from a 22+.  Inconsistent with supplements; discussed risks of deficiency. Reports "dumping-like symptoms" when taking OTC iron supplement. No sodas, drinking while eating, etc. Return in 6 mos.     Surgery date: 04/13/12 Surgery type: RYGB Start weight at [facility]: 262.3 lbs (02/24/12)  Weight today: 182.5 lbs Weight change: 33.0 lbs Total weight lost: 79.8 lbs BMI: 32.3 kg/m^2  Weight loss goal: 130 lbs % goal met: 60%   TANITA  BODY COMP RESULTS  02/24/12 05/05/12 06/16/12 07/14/12 11/06/12  %Fat 54.3 52.2% 49.6% 49.5% 42.2%  FM (lbs) 141.5 129.0 113.5 106.5 77.0  FFM (lbs) 119.5 118.5 115.0 109.0 105.5  TBW (lbs) 87.5 87.0 84.0 80.0 77.0   24-hr recall: B (AM): Protein shake (20 oz) - 20g  Snk (AM): None or Cheese Stick  L (PM): 3-4 oz chicken breast, sometimes veggies (25-30g) Snk (PM): [place] yogurt (12g) D (PM): 15 small Shrimp  Snk (PM): SF pudding OR none  Fluid intake: 60-70 oz (Water, SF Green Tea) Estimated total protein intake: 65-75 g  Medications: See medication list Supplementation: Taking inconsistently   Using straws: No Drinking while eating: No Hair loss: No Carbonated beverages: No N/V/D/C:  Dumping-like s/s with OTC iron supplements Dumping syndrome: None  Recent physical activity:  Cardio 2 days/week; Zumba 1 day/week; Strength training T/Th/Sa with personal trainer  Progress Towards Goal(s):  In progress.   Nutritional Diagnosis:  [place]-3.3 Overweight/obesity related to recent RYGB surgery as evidenced by patient following post-op nutritional guidelines for continued weight loss.    Intervention:  Nutrition education/reinforcement.  Monitoring/Evaluation:  Dietary  intake, exercise, and body weight. Follow up in 6 months for 12 month post-op visit.

## 2012-11-06 NOTE — Patient Instructions (Addendum)
Goals:  Follow Phase 3B: High Protein + Non-Starchy Vegetables  Eat 3-6 small meals/snacks, every 3-5 hrs  Increase lean protein foods to meet 60-80g goal  Increase fluid intake to 64oz +  Add 15 grams of carbohydrate (fruit, whole grain, starchy vegetable) with meals  Avoid drinking 15 minutes before, during and 30 minutes after eating  Aim for >30 min of physical activity daily  

## 2012-11-11 ENCOUNTER — Encounter (INDEPENDENT_AMBULATORY_CARE_PROVIDER_SITE_OTHER): Payer: Self-pay | Admitting: General Surgery

## 2012-11-11 ENCOUNTER — Ambulatory Visit (INDEPENDENT_AMBULATORY_CARE_PROVIDER_SITE_OTHER): Payer: No Typology Code available for payment source | Admitting: General Surgery

## 2012-11-11 VITALS — BP 122/83 | HR 74 | Temp 98.2°F | Resp 18 | Ht 63.0 in | Wt 183.4 lb

## 2012-11-11 DIAGNOSIS — Z6832 Body mass index (BMI) 32.0-32.9, adult: Secondary | ICD-10-CM

## 2012-11-11 DIAGNOSIS — Z9884 Bariatric surgery status: Secondary | ICD-10-CM

## 2012-11-11 NOTE — Progress Notes (Signed)
Subjective:     Patient ID: Sandra Nichols, female   DOB: 14-Mar-1976, 36 y.o.   MRN: [id number]  HPI 36 year old Caucasian femalele comes in for long-term followup after undergoing laparoscopic Roux-en-Y gastric bypass on 04/20/2012. I last saw her in the office on August 21. She states that she has been doing very well since her last visit. She did have an episode about a week and a half ago of a GI bug. She states that she had several days of crampy abdominal pain and diarrhea. She states that she lost about 8 pounds during that episode. She states that she's gained a few pounds back since that time. She states that right now she is not having any abdominal pain. She denies any fever, chills, vomiting, reflux, regurgitation. She denies any regurgitation. She denies any dumping syndrome. She denies any diarrhea or constipation. She states that she is very busy in graduate school. She is still going to the gym 3-4 times a week and working out with a Systems analyst. She states that she generally does 20 minutes of cardio before and after each workout session. She states that she also started taking up running. She admits that she has been less than ideally compliant with supplements. She states that she's recently switched to Flintstones multivitamin with iron.  She did mention that she has had several episodes of waves of nausea that would generally occur randomly at times. She has noticed that she's generally eating when it occurs. She also notices a heightened sense of smell during these episodes.  PMHx, PSHx, SOCHx, FAMHx, ALL reviewed and unchanged  Review of Systems 10 point ROS negative except for above    Objective:   Physical Exam BP 122/83  Pulse 74  Temp 98.2 F (36.8 C) (Temporal)  Resp 18  Ht 5\' 3"  (1.6 m)  Wt 183 lb 6.4 oz (83.19 kg)  BMI 32.49 kg/m2  See flowsheet  Gen: alert, NAD, non-toxic appearing HEENT: normocephalic, atraumatic; pupils equal, no scleral icterus, neck supple,  no lymphadenopathy Pulm: Lungs clear to auscultation, symmetric chest rise CV: regular rate and rhythm Abd: soft, nontender, nondistended. Well-healed trocar sites. No incisional hernia.  Ext: no edema, normal, symmetric strength Neuro: nonfocal, sensation grossly intact Psych: appropriate, judgment normal     Assessment:     S/p LRYGB obesity    Plan:     I think she is doing great. Her total weight loss to date has been 78.6 pounds. Her current BMI is 32.5. Her preoperative BMI was 46.4. I encouraged her to continue with the routine exercising. I also stressed the importance of taking her supplements. I also encouraged her to keep a diary journal of these events where she has these episodes of waves of nausea. I told her to inform me if it is persistent. It is possible she could be having a shift in her insulin or blood glucose level during these events. She is due for routine surveillance labs. Our office contact her with the lab order. Followup 3 months  Mary Sella. Andrey Campanile, MD, FACS General, Bariatric, & Minimally Invasive Surgery [facility], [place]

## 2012-11-11 NOTE — Patient Instructions (Signed)
Keep up the great work You are due for routine lab work - Lesly Rubenstein will call you with the details

## 2012-11-13 ENCOUNTER — Telehealth (INDEPENDENT_AMBULATORY_CARE_PROVIDER_SITE_OTHER): Payer: Self-pay | Admitting: General Surgery

## 2012-11-13 ENCOUNTER — Other Ambulatory Visit (INDEPENDENT_AMBULATORY_CARE_PROVIDER_SITE_OTHER): Payer: Self-pay | Admitting: General Surgery

## 2012-11-13 DIAGNOSIS — Z9884 Bariatric surgery status: Secondary | ICD-10-CM

## 2012-11-13 NOTE — Telephone Encounter (Signed)
LMOM letting pt know that I have faxed her lab orders to [facility] and that she can have them done whenever is convenient for her.  I also left the address of [facility].

## 2012-11-13 NOTE — Telephone Encounter (Signed)
Axed [facility] labs for pt to have done.  Faxed to [phone].  Confirmation was received.

## 2012-12-10 ENCOUNTER — Telehealth (INDEPENDENT_AMBULATORY_CARE_PROVIDER_SITE_OTHER): Payer: Self-pay | Admitting: General Surgery

## 2012-12-10 NOTE — Telephone Encounter (Signed)
Message copied by Liliana Cline on Thu Dec 10, 2012  3:19 PM ------      Message from: Larry Sierras      Created: Thu Dec 10, 2012  3:13 PM      Regarding: ORDER      Contact: [phone]       NEEDS NEW LAB ORDER FOR SPECTRUM/PREV ORDER LAPSED/CALL PT IF QUESTIONS/GM

## 2012-12-10 NOTE — Telephone Encounter (Signed)
Spoke with patient and she thought orders lapsed in a month. Made her aware orders still good and she will have drawn next week.

## 2013-01-08 ENCOUNTER — Encounter (INDEPENDENT_AMBULATORY_CARE_PROVIDER_SITE_OTHER): Payer: Self-pay | Admitting: General Surgery

## 2013-02-03 LAB — CBC WITH DIFFERENTIAL/PLATELET
Basophils Absolute: 0 10*3/uL (ref 0.0–0.1)
Eosinophils Absolute: 0.1 10*3/uL (ref 0.0–0.7)
Eosinophils Relative: 2 % (ref 0–5)
MCH: 28.9 pg (ref 26.0–34.0)
MCV: 86.4 fL (ref 78.0–100.0)
Neutrophils Relative %: 61 % (ref 43–77)
Platelets: 250 10*3/uL (ref 150–400)
RDW: 13.4 % (ref 11.5–15.5)
WBC: 6.2 10*3/uL (ref 4.0–10.5)

## 2013-02-03 LAB — LIPID PANEL
Cholesterol: 168 mg/dL (ref 0–200)
HDL: 57 mg/dL (ref >39)
Total CHOL/HDL Ratio: 2.9 Ratio
VLDL: 17 mg/dL (ref 0–40)

## 2013-02-03 LAB — COMPREHENSIVE METABOLIC PANEL
ALT: 23 U/L (ref 0–35)
AST: 18 U/L (ref 0–37)
BUN: 12 mg/dL (ref 6–23)
Calcium: 9.5 mg/dL (ref 8.4–10.5)
Chloride: 103 mEq/L (ref 96–112)
Creat: 0.73 mg/dL (ref 0.50–1.10)
Total Bilirubin: 0.6 mg/dL (ref 0.3–1.2)

## 2013-02-03 LAB — FOLATE: Folate: >20 ng/mL

## 2013-02-08 ENCOUNTER — Telehealth (INDEPENDENT_AMBULATORY_CARE_PROVIDER_SITE_OTHER): Payer: Self-pay | Admitting: General Surgery

## 2013-02-08 NOTE — Telephone Encounter (Signed)
Patient made aware lab results are good. She will call with any problems prior to her next appt.

## 2013-02-08 NOTE — Telephone Encounter (Signed)
Message copied by Liliana Cline on Mon Feb 08, 2013  9:14 AM ------      Message from: Andrey Campanile, ERIC M      Created: Fri Feb 05, 2013  1:05 AM       Labs look great ------

## 2013-02-09 ENCOUNTER — Other Ambulatory Visit: Payer: Self-pay

## 2013-02-18 ENCOUNTER — Ambulatory Visit (INDEPENDENT_AMBULATORY_CARE_PROVIDER_SITE_OTHER): Payer: No Typology Code available for payment source | Admitting: General Surgery

## 2013-02-18 ENCOUNTER — Telehealth (INDEPENDENT_AMBULATORY_CARE_PROVIDER_SITE_OTHER): Payer: Self-pay | Admitting: General Surgery

## 2013-02-18 ENCOUNTER — Encounter (INDEPENDENT_AMBULATORY_CARE_PROVIDER_SITE_OTHER): Payer: Self-pay | Admitting: General Surgery

## 2013-02-18 VITALS — BP 128/84 | HR 62 | Temp 97.9°F | Resp 18 | Ht 63.0 in | Wt 172.8 lb

## 2013-02-18 DIAGNOSIS — E669 Obesity, unspecified: Secondary | ICD-10-CM | POA: Insufficient documentation

## 2013-02-18 DIAGNOSIS — Z9884 Bariatric surgery status: Secondary | ICD-10-CM

## 2013-02-18 DIAGNOSIS — R200 Anesthesia of skin: Secondary | ICD-10-CM

## 2013-02-18 NOTE — Patient Instructions (Signed)
Keep up the great work!

## 2013-02-18 NOTE — Progress Notes (Signed)
Subjective:     Patient ID: Sandra Nichols, female   DOB: [birth year], 37 y.o.   MRN: [id number]  HPI 37 year old Caucasian female comes in for long-term followup after undergoing laparoscopic Roux-en-Y gastric bypass in 04/20/2012. I last saw her in the office in November 39. She states that she has been doing well since her last visit. She denies any fever, chills, abdominal pain, nausea, vomiting, reflux, diarrhea or constipation. She is having one bowel movement a day. She is not having to use any laxatives. She is taking her supplements  On a more regular basis. She is taking 2 Flintstone tablets a day as well as to celebrate calcium chew. She is still exercising regularly. She is no longer using a Systems analyst. She is working now about 3 days a week. She does a combination of Zumba classes as well as cardio. For the last couple weeks she has noticed some intermittent numbness in her lower extremities. It starts at the knee and goes down more towards her toes. It occurs on both extremities. On the right foot the numbness involved the last 3 toes. On the left leg the numbness is more on the top of the foot. She states that it'll start with a numbness sensation and occasional like pins and needles and then she'll get a warm feeling. She denies any weakness in his extremities. She denies any recent trauma to that area.  PMHx, PSHx, SOCHx, FAMHx, ALL reviewed and unchanged  Review of Systems 12 point review of systems is performed and all systems are negative except for what is mentioned in the history of present illness    Objective:   Physical Exam BP 128/84  Pulse 62  Temp(Src) 97.9 F (36.6 C) (Temporal)  Resp 18  Ht 5\' 3"  (1.6 m)  Wt 172 lb 12.8 oz (78.382 kg)  BMI 30.62 kg/m2  Gen: alert, NAD, non-toxic appearing Pupils: equal, no scleral icterus Pulm: Lungs clear to auscultation, symmetric chest rise CV: regular rate and rhythm Abd: soft, nontender, nondistended. Well-healed trocar  sites. No cellulitis. No incisional hernia Ext: no edema, no calf tenderness. Strength symmetric, sensation grossly intact Skin: no rash, no jaundice     Assessment:     Status post laparoscopic Roux-en-Y gastric bypass 04/20/2012 Elevated blood pressure-resolved     Plan:     I think overall she is doing quite well. I congratulated her on her weight loss. Her total weight loss today is approximately 89.2 pounds. She has lost an additional 10 pounds since her last visit in November. I stressed the importance of ongoing regular exercise. I encouraged her to continue with her daily supplements. She had surveillance labs done about a week and a half ago. Her metabolic panel, CBC, lipid panel, folate, vitamin B 12 levels were all within normal limits.  Because she is having some numbness in bilateral lower extremities I do believe we need to check a thiamine level to rule out a thiamine deficiency. Followup 3 months  Mary Sella. Andrey Campanile, MD, FACS General, Bariatric, & Minimally Invasive Surgery [facility], [place]

## 2013-02-18 NOTE — Telephone Encounter (Signed)
Message copied by Liliana Cline on Thu Feb 18, 2013  2:30 PM ------      Message from: Andrey Campanile, ERIC M      Created: Thu Feb 18, 2013 12:34 PM       Derenda Giddings - please order Thiamine level on pt. To evaluate her peripheral numbness. We also need to get a vit D (25 hydroxy) level on her as well.             Mary Sella. Andrey Campanile, MD, FACS      General, Bariatric, & Minimally Invasive Surgery      [facility] [place]       ------

## 2013-02-18 NOTE — Telephone Encounter (Signed)
Left message on machine for patient to call back and ask for me. To make her aware Dr Andrey Campanile does want to check some vitamin levels on her. TO go to the same lab she went to before [facility]), order has been placed and sent to them. Awaiting call back to make patient aware.

## 2013-02-22 NOTE — Telephone Encounter (Signed)
Patient made aware to get labs drawn. She states she will go tomorrow.

## 2013-03-09 ENCOUNTER — Telehealth (INDEPENDENT_AMBULATORY_CARE_PROVIDER_SITE_OTHER): Payer: Self-pay | Admitting: General Surgery

## 2013-03-09 NOTE — Telephone Encounter (Signed)
LM for pt to call to discuss labs results and see how paraethesias doing.

## 2013-03-12 IMAGING — RF DG UGI W/ GASTROGRAFIN
15 of 24 series · 15 of 24 positions shown · non-contrast
Comparison: 02/13/2012

CLINICAL DATA: Postop for gastric bypass.  1 day postop.

UPPER GI SERIES WITH KUB
TECHNIQUE: Routine upper GI series was performed with dilute knee
300, 50 ml
Fluoroscopy Time: 3.2 minutes

[Series 1: run · 1 of 2 slices shown (1 of 15)]
[im 1/2]
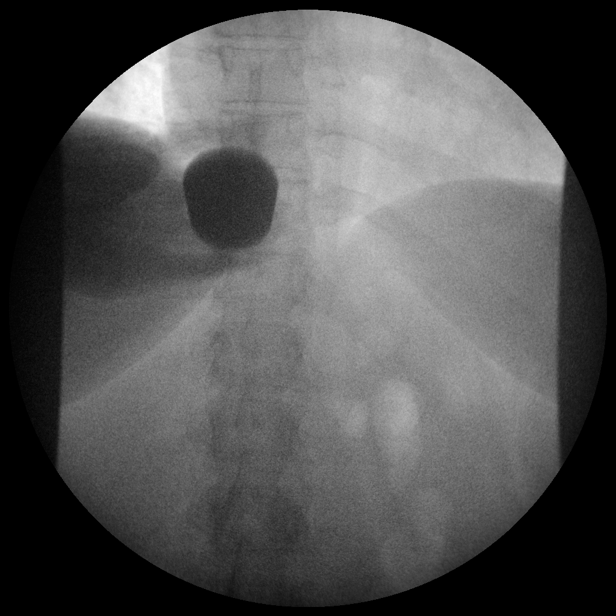

[Series 3: run · 1 of 1 slices shown (2 of 15)]
[im 1/1]
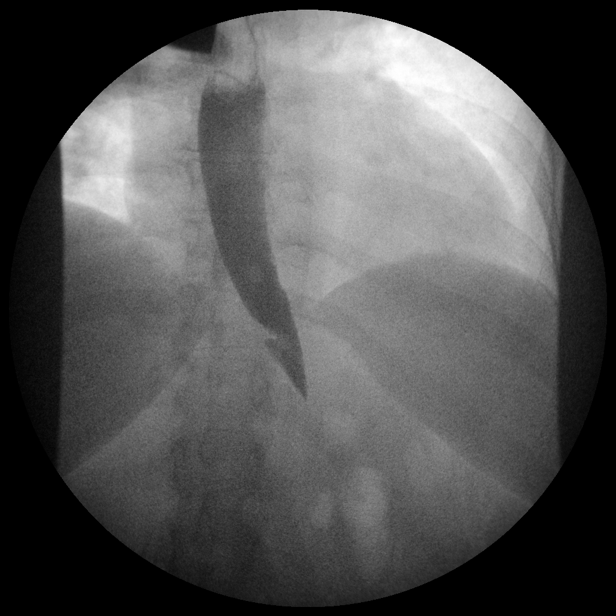

[Series 5: run · 1 of 1 slices shown (3 of 15)]
[im 1/1]
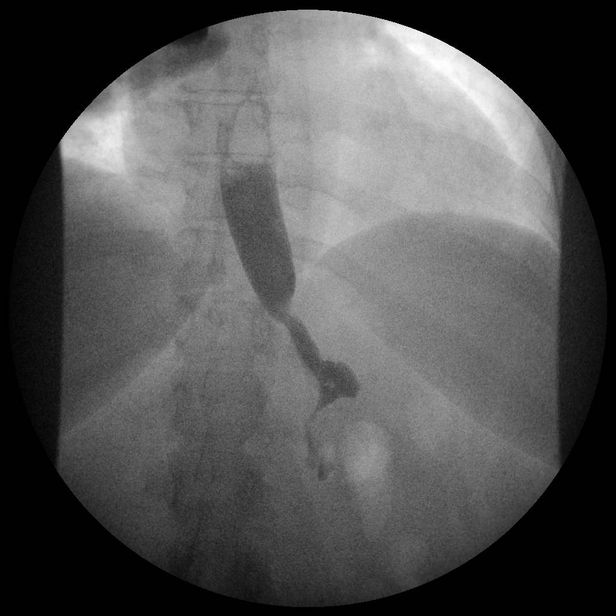

[Series 6: run · 1 of 1 slices shown (4 of 15)]
[im 1/1]
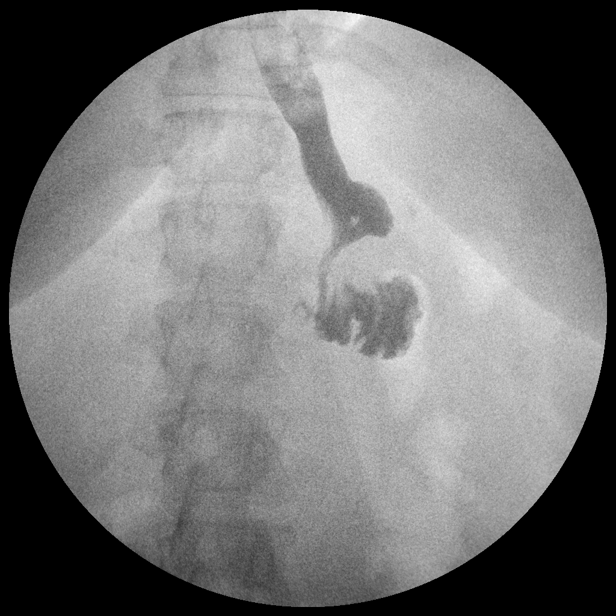

[Series 8: run · 1 of 1 slices shown (5 of 15)]
[im 1/1]
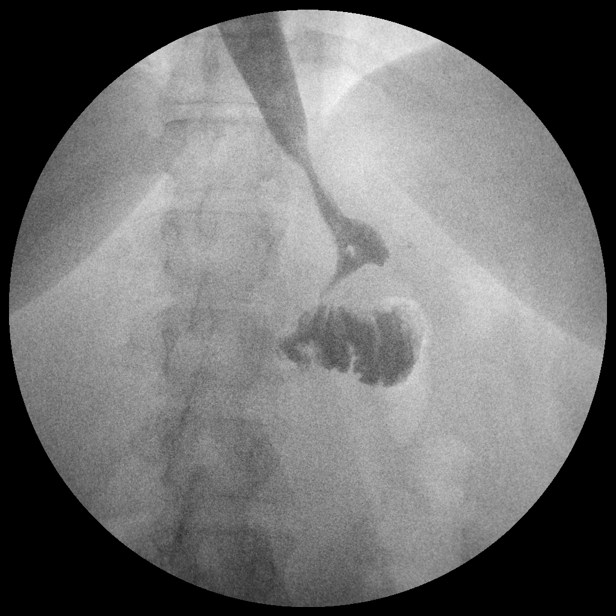

[Series 9: run · 1 of 1 slices shown (6 of 15)]
[im 1/1]
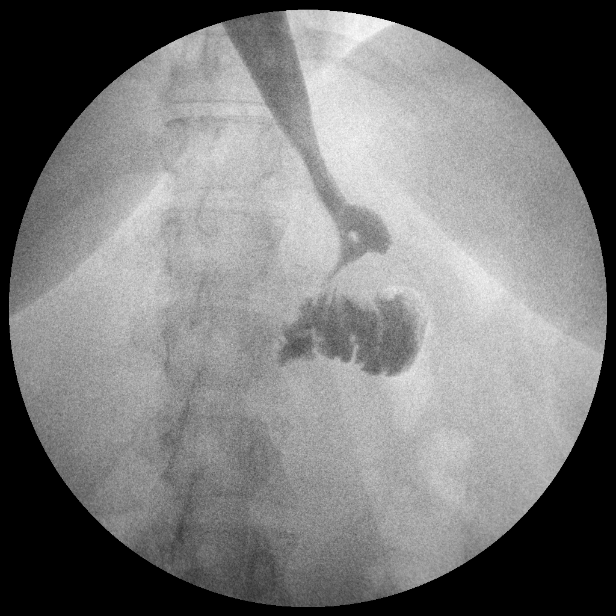

[Series 11: run · 1 of 1 slices shown (7 of 15)]
[im 1/1]
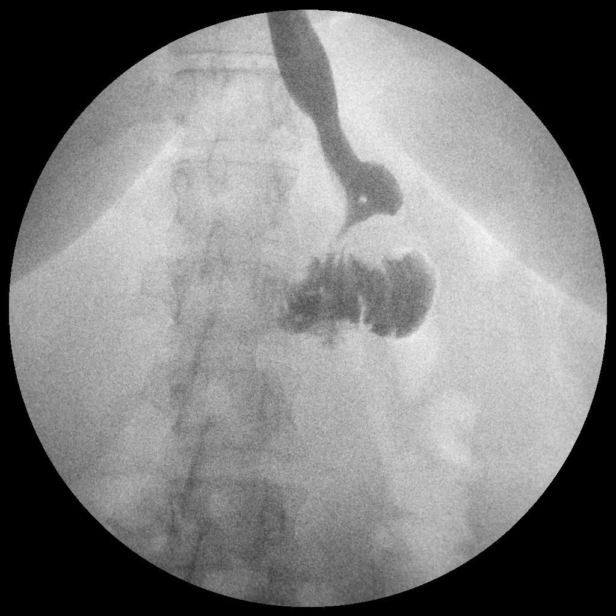

[Series 13: run · 1 of 1 slices shown (8 of 15)]
[im 1/1]
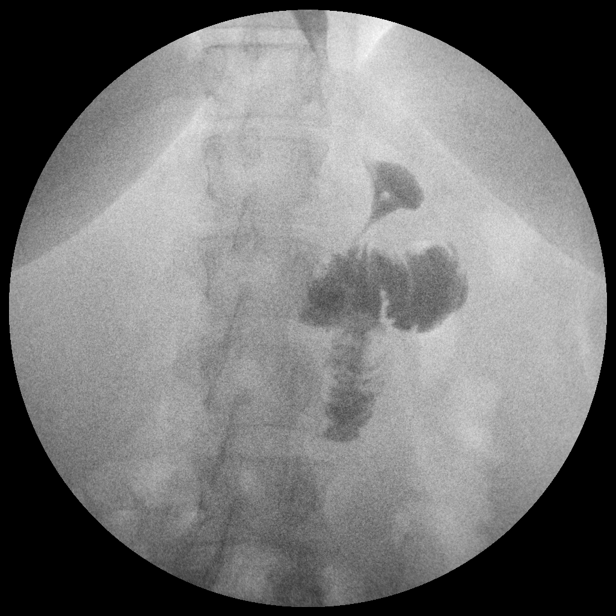

[Series 14: run · 1 of 1 slices shown (9 of 15)]
[im 1/1]
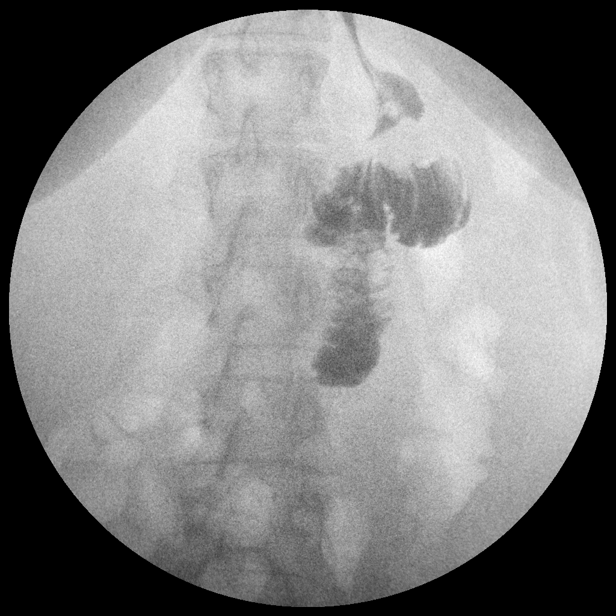

[Series 16: run · 1 of 1 slices shown (10 of 15)]
[im 1/1]
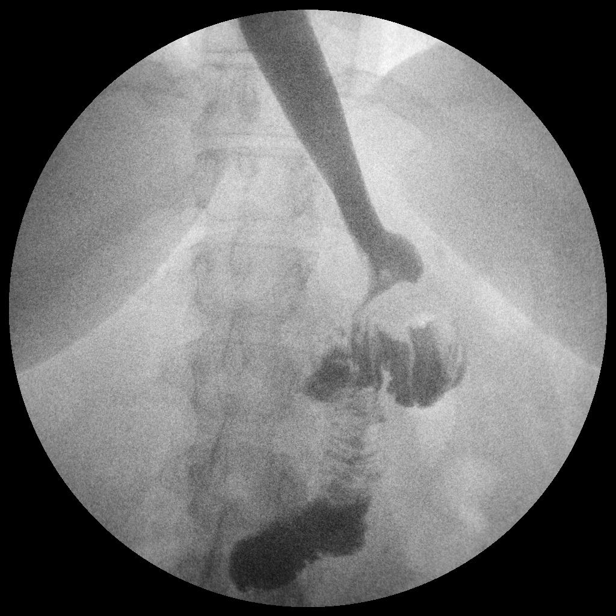

[Series 17: run · 1 of 1 slices shown (11 of 15)]
[im 1/1]
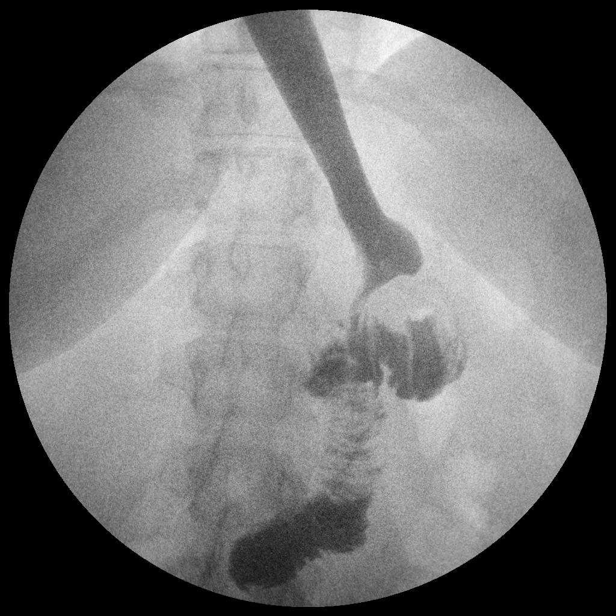

[Series 19: run · 1 of 1 slices shown (12 of 15)]
[im 1/1]
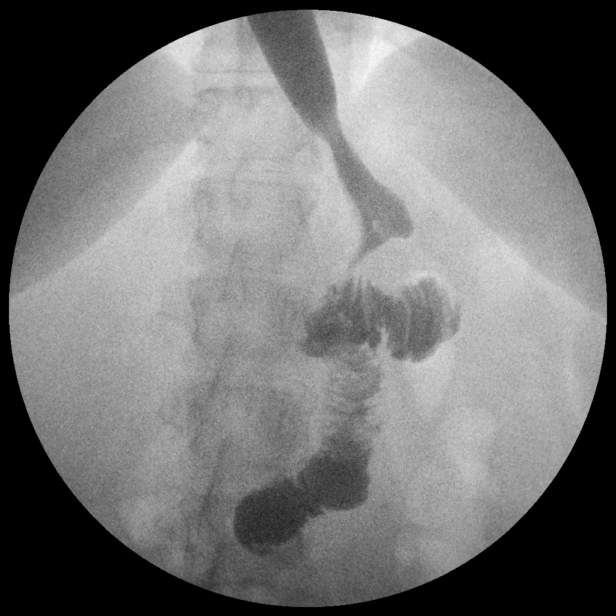

[Series 21: run · 1 of 1 slices shown (13 of 15)]
[im 1/1]
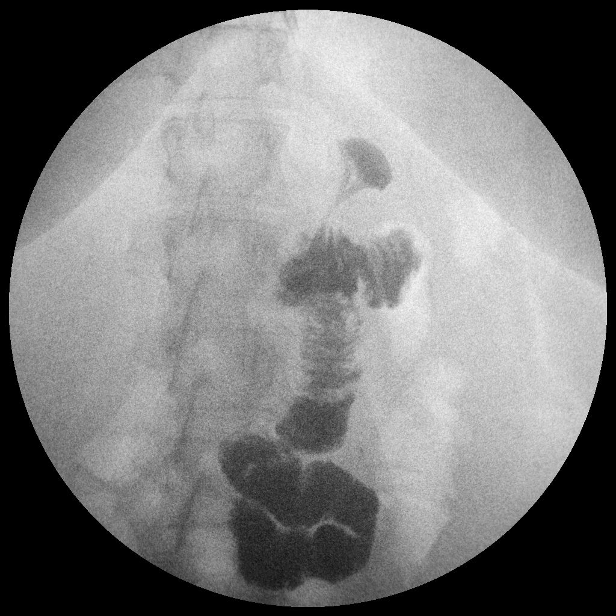

[Series 22: run · 1 of 1 slices shown (14 of 15)]
[im 1/1]
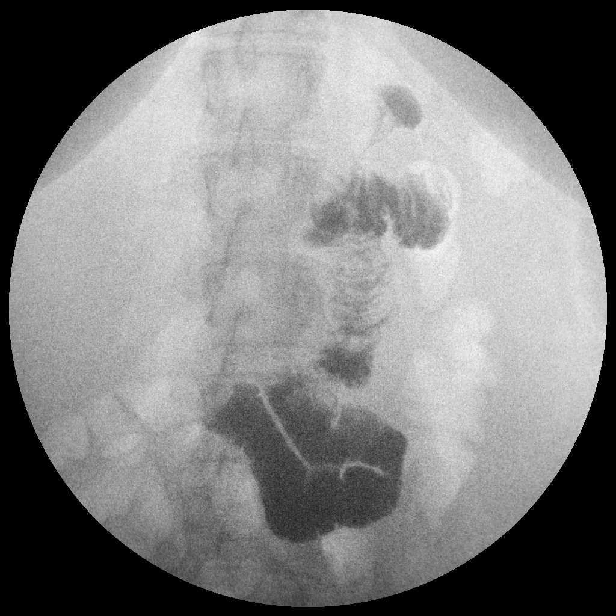

[Series 24: run · 1 of 1 slices shown (15 of 15)]
[im 1/1]
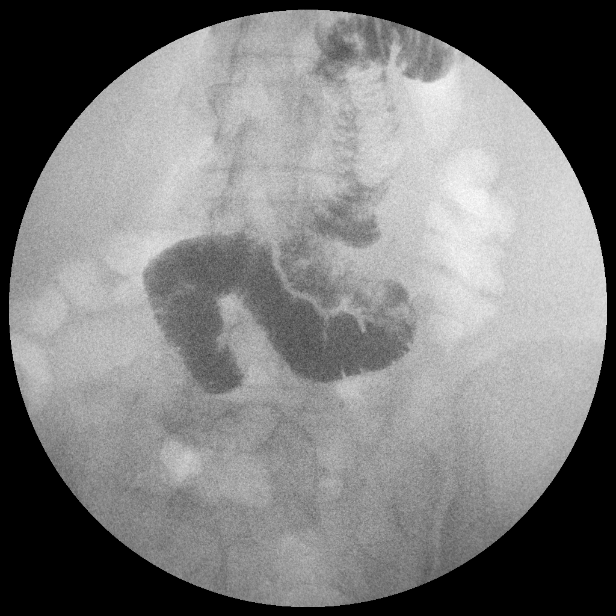

[15 of 24 positions shown; findings below may reference images not displayed]

FINDINGS: Normal appearance of the distal esophagus.  The gastric
remnant is normal.  The roux loop fills promptly.  The jejunal
jejunal anastomoses is likely traversed and within normal limits.

The scout films demonstrate a nonobstructive bowel gas pattern
without free intraperitoneal air.
IMPRESSION: Expected appearance after Roux-en-Y gastric bypass.

## 2013-04-12 ENCOUNTER — Telehealth (INDEPENDENT_AMBULATORY_CARE_PROVIDER_SITE_OTHER): Payer: Self-pay | Admitting: General Surgery

## 2013-04-12 ENCOUNTER — Encounter (INDEPENDENT_AMBULATORY_CARE_PROVIDER_SITE_OTHER): Payer: Self-pay | Admitting: General Surgery

## 2013-04-12 NOTE — Telephone Encounter (Signed)
Called patient to see if she was still experiencing some numbness in her bilateral lower extremities. She states that she was but she thought it was getting better. It mainly occurs on her way to the gym. Her thiamine level was low normal at around 11. She denies any vision changes or calf tenderness. She denies any malaise. Just to be on the safe side I did recommend that she take a dedicated thiamine supplement of around 100 mg per day. I told her she could find that medication on line at any of the bariatric supplement websites such as bariatric advantage or celebrate. I told her to take it for at least one month. She already has a f/u appt with me.

## 2013-05-06 ENCOUNTER — Ambulatory Visit: Payer: No Typology Code available for payment source | Admitting: *Deleted

## 2013-05-21 ENCOUNTER — Ambulatory Visit (INDEPENDENT_AMBULATORY_CARE_PROVIDER_SITE_OTHER): Payer: No Typology Code available for payment source | Admitting: General Surgery

## 2013-05-21 ENCOUNTER — Encounter (INDEPENDENT_AMBULATORY_CARE_PROVIDER_SITE_OTHER): Payer: Self-pay | Admitting: General Surgery

## 2013-05-21 VITALS — BP 114/70 | HR 72 | Resp 18 | Ht 63.0 in | Wt 175.4 lb

## 2013-05-21 DIAGNOSIS — K912 Postsurgical malabsorption, not elsewhere classified: Secondary | ICD-10-CM

## 2013-05-21 DIAGNOSIS — Z09 Encounter for follow-up examination after completed treatment for conditions other than malignant neoplasm: Secondary | ICD-10-CM

## 2013-05-21 DIAGNOSIS — Z9884 Bariatric surgery status: Secondary | ICD-10-CM

## 2013-05-21 NOTE — Patient Instructions (Signed)
Keep up the good work Get back on the exercise wagon

## 2013-05-21 NOTE — Progress Notes (Signed)
Subjective:     Patient ID: Sandra Nichols, female   DOB: [birth year], 37 y.o.   MRN: [id number]  HPI 37 year old Caucasian female comes in today for long-term followup after undergoing laparoscopic Roux-en-Y gastric bypass surgery on 04/20/2012. I last saw her in the office around 02/18/2013. Her highest preoperative weight was 265 pounds. Today her weight is 175.4 pounds. She has gained about 3 pounds since her last visit. She states that she has changed jobs and is now working at [employer]. She is now more sedentary at her job. 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 She is also not exercising as much as she had been over the past year. She is only exercising 2-3 days a week alternating between running and doing elliptical machine. She was using a fitbit but recently broke. She reports some mild nausea as well as some mild constipation. She also reports some fatigue which she attributes to her lack of exercise. The numbness and tingling in her anterior lower legs is slightly better. She attributes it to sitting for prolonged periods of time. She reports compliance with her supplements. She denies any reflux or regurgitation. She denies any dumping. She is not using any laxatives. She is having one to 2 bowel movements a day.  PMHx, PSHx, SOCHx, FAMHx, ALL reviewed   Review of Systems 10 point review of systems was performed and all systems are negative except for what is mentioned in the history of present illness    Objective:   Physical Exam BP 114/70  Pulse 72  Resp 18  Ht 5\' 3"  (1.6 m)  Wt 175 lb 6.4 oz (79.561 kg)  BMI 31.08 kg/m2  Gen: alert, NAD, non-toxic appearing Pupils: equal, no scleral icterus Pulm: Lungs clear to auscultation, symmetric chest rise CV: regular rate and rhythm Abd: soft, nontender, nondistended. Well-healed trocar sites. No cellulitis. No incisional hernia Ext: no edema, no calf tenderness Skin: no rash, no jaundice     Assessment:     Status post  laparoscopic Roux-en-Y gastric bypass     Plan:     I congratulated her on her weight loss. She has lost about 90 pounds since surgery. We did discuss her 3 pound weight regain. We stressed the importance of increasing her activity level. We discussed strategies to incorporate exercise on a more frequent basis. We discussed the importance of increasing her activity with respect to  Maintaining her weight loss.  When she first complained of the lower extremity paresthesias we did a complete lab evaluation in February. At that time she had low normal spine levels. She was advised to take supplemental thiamine. Since she is now a year out from surgery she is due for lab surveillance. Assuming no major issues on her labs I will see her back in 6 months   Mary Sella. Andrey Campanile, MD, FACS General, Bariatric, & Minimally Invasive Surgery [facility], [place]

## 2013-05-24 NOTE — Addendum Note (Signed)
Addended by: Maryan Puls on: 05/24/2013 01:32 PM   Modules accepted: Orders

## 2013-07-12 ENCOUNTER — Ambulatory Visit (INDEPENDENT_AMBULATORY_CARE_PROVIDER_SITE_OTHER): Payer: BC Managed Care – PPO | Admitting: Gynecology

## 2013-07-12 ENCOUNTER — Encounter: Payer: Self-pay | Admitting: Gynecology

## 2013-07-12 VITALS — BP 110/72 | HR 60 | Temp 98.7°F | Ht 62.75 in | Wt 167.0 lb

## 2013-07-12 DIAGNOSIS — N6320 Unspecified lump in the left breast, unspecified quadrant: Secondary | ICD-10-CM

## 2013-07-12 DIAGNOSIS — N9089 Other specified noninflammatory disorders of vulva and perineum: Secondary | ICD-10-CM

## 2013-07-12 DIAGNOSIS — Z01419 Encounter for gynecological examination (general) (routine) without abnormal findings: Secondary | ICD-10-CM

## 2013-07-12 DIAGNOSIS — N63 Unspecified lump in unspecified breast: Secondary | ICD-10-CM

## 2013-07-12 NOTE — Progress Notes (Signed)
Patient ID: Sandra Nichols, female   DOB: [birth year], 37 y.o.   MRN: [id number] 37 y.o. Married, Caucasian female   [phone] here for annual exam. Pt is currently sexually active.  Pt is s/p gastric bypass 27m ago has lost 98# and continues to lose but has slowed down.  All labs by Dr Andrey Campanile- gen surgeon.  Pt reports cycles are very light about 1d, monthy.  Pt is s/p ablation-novasure.  Pt denies post-coital bleeding, no dysparunia.  Pt reports tenderness in upper left breast for the past several months, associated with her menses.  Mass comes and goes.  Lastly she reports a skin tag on her labia on the left that gets irritated with both sex and exercise  Patient's last menstrual period was 06/21/2013.          Sexually active: yes  The current method of family planning is ablation.    Exercising: yes  running 3-4 times/week Last pap:07/09/12, normal, neg HR HPV Alcohol: 1 drink weekly Tobacco: none [id number]    Health Maintenance  Topic Date Due  . Pap Smear  02/06/1992  . Tetanus/tdap  02/05/1993  . Influenza Vaccine  08/23/2013    Family History  Problem Relation Age of Onset  . Heart disease Mother   . Diabetes Mother   . Diabetes Sister   . Cancer Paternal Grandmother     colon    Patient Active Problem List   Diagnosis Date Noted  . Obesity (BMI 30-39.9) 02/18/2013  . s/p Lap Roux-en-Y gastric bypass 04/20/2012    Past Medical History  Diagnosis Date  . Irritable bowel syndrome   . Anemia   . Panic attack   . OCD (obsessive compulsive disorder)   . GERD (gastroesophageal reflux disease)     Past Surgical History  Procedure Laterality Date  . Myomectomy    . Endometrial ablation    . Tubal ligation  01/27/2002  . Gastric roux-en-y  04/20/2012    Procedure: LAPAROSCOPIC ROUX-EN-Y GASTRIC BYPASS WITH UPPER ENDOSCOPY;  Surgeon: Atilano Ina, MD,FACS;  Location: [facility] ORS;  Service: General;;    Allergies: Prednisone  Current Outpatient Prescriptions  Medication Sig  Dispense Refill  . flintstones complete (FLINTSTONES) 60 MG chewable tablet Chew 2 tablets by mouth daily.      Marland Kitchen FLUoxetine (PROZAC) 40 MG capsule Take 40 mg by mouth every morning.       . Calcium-Vitamin D-Vitamin K (CALCIUM SOFT CHEWS PO) Take by mouth.      . clonazePAM (KLONOPIN) 0.5 MG tablet Take 0.5 mg by mouth 2 (two) times daily as needed. anxiety      . ergocalciferol (VITAMIN D2) 50000 UNITS capsule Take 50,000 Units by mouth once a week. wednesday       No current facility-administered medications for this visit.    ROS: Pertinent items are noted in HPI.  Exam:    BP 110/72  Pulse 60  Temp(Src) 98.7 F (37.1 C) (Oral)  Ht 5' 2.75" (1.594 m)  Wt 167 lb (75.751 kg)  BMI 29.81 kg/m2  LMP 06/21/2013 Weight change: @WEIGHTCHANGE @ Last 3 height recordings:  Ht Readings from Last 3 Encounters:  07/12/13 5' 2.75" (1.594 m)  05/21/13 5\' 3"  (1.6 m)  02/18/13 5\' 3"  (1.6 m)   General appearance: alert, cooperative and appears stated age Head: Normocephalic, without obvious abnormality, atraumatic Neck: no adenopathy, no carotid bruit, no JVD, supple, symmetrical, trachea midline and thyroid not enlarged, symmetric, no tenderness/mass/nodules Lungs: clear to auscultation bilaterally Breasts: normal  appearance, no masses or tenderness, increased fibroglandular tissue noted at 12 o'clock on left consistent with area of concern. Heart: regular rate and rhythm, S1, S2 normal, no murmur, click, rub or gallop Abdomen: soft, non-tender; bowel sounds normal; no masses,  no organomegaly Extremities: extremities normal, atraumatic, no cyanosis or edema Skin: Skin color, texture, turgor normal. No rashes or lesions Lymph nodes: Cervical, supraclavicular, and axillary nodes normal. no inguinal nodes palpated Neurologic: Grossly normal   Pelvic: External genitalia:  Small skin tag on left majora otherwise unremarkable              Urethra: normal appearing urethra with no masses,  tenderness or lesions              Bartholins and Skenes: normal                 Vagina: normal appearing vagina with normal color and discharge, no lesions              Cervix: normal appearance              Pap taken: no        Bimanual Exam:  Uterus:  uterus is normal size, shape, consistency and nontender                                      Adnexa:    normal adnexa in size, nontender and no masses                                      Rectovaginal: Confirms                                      Anus:  normal sphincter tone, no lesions  A: well woman Labial lesion  P: mammogram will get diagnostic as due for first mammo in 26m. Screening on right pap smear due 2108 Doing very well after ablation Skin tag removed per pt request, area cleansed with betadine, xylocaine jelly and injected with 0.2cc 1%lidocaine base excised, silver nitrate placed return annually or prn   An After Visit Summary was printed and given to the patient.

## 2013-07-15 ENCOUNTER — Encounter: Payer: Self-pay | Admitting: *Deleted

## 2013-07-15 ENCOUNTER — Encounter: Payer: BC Managed Care – PPO | Attending: General Surgery | Admitting: *Deleted

## 2013-07-15 VITALS — Ht 63.0 in | Wt 168.5 lb

## 2013-07-15 DIAGNOSIS — E669 Obesity, unspecified: Secondary | ICD-10-CM | POA: Insufficient documentation

## 2013-07-15 DIAGNOSIS — Z713 Dietary counseling and surveillance: Secondary | ICD-10-CM | POA: Insufficient documentation

## 2013-07-15 NOTE — Patient Instructions (Addendum)
Goals:  Follow Phase 3B: High Protein + Non-Starchy Vegetables  Aim for 15 grams of carbohydrate (fruit, whole grain, starchy vegetable) with meals/snacks  Aim for >30 min of physical activity daily  Contact PCP or Dr. Andrey Campanile regarding episodes of low blood sugars.  TANITA  BODY COMP RESULTS  02/24/12 05/05/12 06/16/12 07/14/12 11/06/12 07/15/13  %Fat 54.3 52.2% 49.6% 49.5% 42.2% 36.5%  FM (lbs) 141.5 129.0 113.5 106.5 77.0 61.5  FFM (lbs) 119.5 118.5 115.0 109.0 105.5 107.0  TBW (lbs) 87.5 87.0 84.0 80.0 77.0 78.5

## 2013-07-15 NOTE — Progress Notes (Addendum)
Follow-up visit:  15 Months Post-Operative RYGB Surgery  Medical Nutrition Therapy:  Appt start time: 0430   End time:  0500.  Primary concerns today: Post-operative Bariatric Surgery Nutrition Management. Sandra Nichols returns today for 15 month f/u. Doing extremely well with a total weight loss of 93.8 lbs (80 lbs of FAT MASS). Reports mild nausea at times and weekly episodes of hypoglycemia. Also states she is tired all the time.  Advised to contact Dr. Andrey Campanile or PCP asap. No trend noted in food choices to explain cause of low blood sugars. No meal skipping reported.  Surgery date: 04/13/12 Surgery type: RYGB Start weight at [facility]: 262.3 lbs (02/24/12)  Weight today: 168.5 lbs Weight change: 14.0 lbs Total weight lost: 93.8 lbs BMI: 29.8 kg/m^2  Weight loss goal: 120 lbs (new goal per pt) % goal met: 66%   TANITA  BODY COMP RESULTS  02/24/12 05/05/12 06/16/12 07/14/12 11/06/12 07/15/13  %Fat 54.3 52.2% 49.6% 49.5% 42.2% 36.5%  FM (lbs) 141.5 129.0 113.5 106.5 77.0 61.5  FFM (lbs) 119.5 118.5 115.0 109.0 105.5 107.0  TBW (lbs) 87.5 87.0 84.0 80.0 77.0 78.5   24-hr recall: B (AM): 8 oz Premier shake - 18g  Snk (AM): 1-1.5 c raw veggies w/ homemade LF ranch OR watermelon + protein  L (PM): Lean cuisine (low CHO) - 15g Snk (PM): [place] yogurt OR cottage cheese w/ fruit - 12-15g D (PM): 3-4 oz tuna steak, sm amount of veggies - 20-25g Snk (PM): Mini bag of Popsecret kettle corn - 3g  Fluid intake: 60-70 oz (Water, Shake, SF Green Tea) Estimated total protein intake: 65-75 g  Medications:  Not taking Klonopin or Vit D (50k IU) Supplementation: Taking regularly - out of calcium; waiting for shipment  Using straws: No Drinking while eating: No Hair loss: No Carbonated beverages: No N/V/D/C:  Some nausea at times; reports hypoglycemia 1x/week for several months Dumping syndrome: None  Recent physical activity:  Resumed working out 3 weeks ago; currently at 3-4 days/week. Plans to progress to 5  days/wk.  60 min cardio  Progress Towards Goal(s):  In progress.   Nutritional Diagnosis:  [place]-3.3 Overweight/obesity related to recent RYGB surgery as evidenced by patient following post-op nutritional guidelines for continued weight loss.    Intervention:  Nutrition education/reinforcement.  Monitoring/Evaluation:  Dietary intake, exercise, and body weight. Follow up in 6 months for 18 month post-op visit.

## 2013-07-21 ENCOUNTER — Other Ambulatory Visit: Payer: Self-pay | Admitting: Gynecology

## 2013-07-21 DIAGNOSIS — N6320 Unspecified lump in the left breast, unspecified quadrant: Secondary | ICD-10-CM

## 2013-08-05 ENCOUNTER — Ambulatory Visit
Admission: RE | Admit: 2013-08-05 | Discharge: 2013-08-05 | Disposition: A | Discharge status: Home / Self Care | Payer: BC Managed Care – PPO | Source: Ambulatory Visit | Attending: Gynecology | Admitting: Gynecology

## 2013-08-05 DIAGNOSIS — N6320 Unspecified lump in the left breast, unspecified quadrant: Secondary | ICD-10-CM

## 2013-08-09 NOTE — Progress Notes (Signed)
06 recall 02-07-14 entered.

## 2013-10-08 ENCOUNTER — Encounter (INDEPENDENT_AMBULATORY_CARE_PROVIDER_SITE_OTHER): Payer: Self-pay | Admitting: General Surgery

## 2013-10-28 ENCOUNTER — Other Ambulatory Visit: Payer: Self-pay

## 2013-12-23 HISTORY — PX: LASIK: SHX215

## 2014-01-12 ENCOUNTER — Encounter: Payer: Self-pay | Admitting: Gynecology

## 2014-02-14 ENCOUNTER — Encounter: Payer: Self-pay | Admitting: Obstetrics & Gynecology

## 2014-03-24 ENCOUNTER — Telehealth (INDEPENDENT_AMBULATORY_CARE_PROVIDER_SITE_OTHER): Payer: Self-pay

## 2014-03-24 ENCOUNTER — Telehealth (INDEPENDENT_AMBULATORY_CARE_PROVIDER_SITE_OTHER): Payer: Self-pay | Admitting: General Surgery

## 2014-03-24 ENCOUNTER — Other Ambulatory Visit (INDEPENDENT_AMBULATORY_CARE_PROVIDER_SITE_OTHER): Payer: Self-pay | Admitting: General Surgery

## 2014-03-24 NOTE — Telephone Encounter (Signed)
Yes - ok to reorder bariatric surveillance labs. It would be preferable for pt to have labs drawn 5 days before her appt with me that way we can review during her appt

## 2014-03-24 NOTE — Telephone Encounter (Signed)
Patient has appt on 04-01-14 is ok to reorder the labs that was cancelled last year

## 2014-03-24 NOTE — Telephone Encounter (Signed)
Called patient to let her know that we will need lab done 5 days before her apt with Dr Andrey CampanileWilson, her apt is on 04-01-14 and patient is aware and she stated that will go tomorrow to get lab work done 03-25-14

## 2014-03-25 LAB — CBC WITH DIFFERENTIAL/PLATELET
BASOS PCT: 0 % (ref 0–1)
Basophils Absolute: 0 10*3/uL (ref 0.0–0.1)
EOS ABS: 0.1 10*3/uL (ref 0.0–0.7)
EOS PCT: 2 % (ref 0–5)
HEMATOCRIT: 39.7 % (ref 36.0–46.0)
HEMOGLOBIN: 13.4 g/dL (ref 12.0–15.0)
LYMPHS ABS: 1.8 10*3/uL (ref 0.7–4.0)
Lymphocytes Relative: 39 % (ref 12–46)
MCH: 30.1 pg (ref 26.0–34.0)
MCHC: 33.8 g/dL (ref 30.0–36.0)
MCV: 89.2 fL (ref 78.0–100.0)
MONO ABS: 0.3 10*3/uL (ref 0.1–1.0)
MONOS PCT: 6 % (ref 3–12)
Neutro Abs: 2.4 10*3/uL (ref 1.7–7.7)
Neutrophils Relative %: 53 % (ref 43–77)
Platelets: 253 10*3/uL (ref 150–400)
RBC: 4.45 MIL/uL (ref 3.87–5.11)
RDW: 12.6 % (ref 11.5–15.5)
WBC: 4.5 10*3/uL (ref 4.0–10.5)

## 2014-03-25 LAB — IRON AND TIBC
%SAT: 22 % (ref 20–55)
Iron: 87 ug/dL (ref 42–145)
TIBC: 400 ug/dL (ref 250–470)
UIBC: 313 ug/dL (ref 125–400)

## 2014-03-25 LAB — VITAMIN B12: Vitamin B-12: 370 pg/mL (ref 211–911)

## 2014-03-25 LAB — MAGNESIUM: MAGNESIUM: 1.9 mg/dL (ref 1.5–2.5)

## 2014-03-25 LAB — FOLATE: Folate: >20 ng/mL

## 2014-03-26 LAB — VITAMIN D 25 HYDROXY (VIT D DEFICIENCY, FRACTURES): VIT D 25 HYDROXY: 31 ng/mL (ref 30–89)

## 2014-04-01 ENCOUNTER — Ambulatory Visit (INDEPENDENT_AMBULATORY_CARE_PROVIDER_SITE_OTHER): Payer: BC Managed Care – PPO | Admitting: General Surgery

## 2014-04-01 ENCOUNTER — Encounter (INDEPENDENT_AMBULATORY_CARE_PROVIDER_SITE_OTHER): Payer: Self-pay | Admitting: General Surgery

## 2014-04-01 VITALS — BP 136/88 | HR 64 | Temp 98.4°F | Resp 14 | Ht 63.0 in | Wt 173.6 lb

## 2014-04-01 DIAGNOSIS — E669 Obesity, unspecified: Secondary | ICD-10-CM

## 2014-04-01 DIAGNOSIS — Z9884 Bariatric surgery status: Secondary | ICD-10-CM

## 2014-04-01 LAB — COMPREHENSIVE METABOLIC PANEL
ALK PHOS: 62 U/L (ref 39–117)
ALT: 18 U/L (ref 0–35)
AST: 17 U/L (ref 0–37)
Albumin: 4 g/dL (ref 3.5–5.2)
BILIRUBIN TOTAL: 0.7 mg/dL (ref 0.2–1.2)
BUN: 14 mg/dL (ref 6–23)
CO2: 25 mEq/L (ref 19–32)
CREATININE: 0.66 mg/dL (ref 0.50–1.10)
Calcium: 9.5 mg/dL (ref 8.4–10.5)
Chloride: 102 mEq/L (ref 96–112)
Glucose, Bld: 76 mg/dL (ref 70–99)
Potassium: 4.2 mEq/L (ref 3.5–5.3)
SODIUM: 137 meq/L (ref 135–145)
TOTAL PROTEIN: 6.8 g/dL (ref 6.0–8.3)

## 2014-04-01 MED ORDER — SCOPOLAMINE 1 MG/3DAYS TD PT72: 1.0000 | MEDICATED_PATCH | TRANSDERMAL | Status: DC

## 2014-04-01 NOTE — Progress Notes (Signed)
Subjective:     Patient ID: Sandra Nichols, female   DOB: 01-21-1976, 38 y.o.   MRN: [id number]  CC: follow up  Procedure: gastric bypass Date of Surgery: 04/20/12]  HPI 38 year old Caucasian female comes in for two-year followup after undergoing laparoscopic Roux-en-Y gastric bypass on 04/20/2012. I last saw her in the office on 05/21/2013. Her weight at that time was 175.6 pounds. Her initial visit weight to the office  was 265 pounds. She states that she is doing well. She did pull her lower back while washing her child's hair. Therefore she has limited some of the cardio she is doing in the gym. She is currently using a heat pad throughout the day. However she is still going to the gym about 4-5 times a week. She is exercising for about 40-50 minutes at a time. She denies any heartburn, nausea, reflux, regurgitation, diarrhea or constipation. She denies any paresthesias. She is taking her supplements. She is currently taking around 1100 calories a day. She reports 2 episodes over the past couple months where about 2 hours after drinking a protein shake with almond milk she got lightheaded and sweaty and had tunnel vision. Other than that she has no complaints.  PMHx, PSHx, SOCHx, FAMHx, ALL reviewed and unchanged  Review of Systems A 12 point Review of systems was performed and all systems are negative except for what is mentioned in the history of present illness     Objective:   Physical Exam BP 136/88  Pulse 64  Temp(Src) 98.4 F (36.9 C) (Oral)  Resp 14  Ht 5\' 3"  (1.6 m)  Wt 173 lb 9.6 oz (78.744 kg)  BMI 30.76 kg/m2  Gen: alert, NAD, non-toxic appearing Pupils: equal, no scleral icterus Pulm: Lungs clear to auscultation, symmetric chest rise CV: regular rate and rhythm Abd: soft, nontender, nondistended. Well-healed trocar sites. No cellulitis. No incisional hernia. Redundant skin Ext: no edema, no calf tenderness, some redundant skin Skin: no rash, no jaundice     Assessment:      Obesity BMI 30.75 History of laparoscopic Roux-en-Y gastric bypass 04/20/2012     Plan:     I think she is doing great. She is maintaining her weight loss. To date she has lost around 91 pounds. We reviewed the labwork she had done earlier in the week which was all normal. However we are missing her metabolic panel and her vitamin B1 level. I encouraged her continued compliance with her supplements. I congratulated her on her commitment to exercising and encouraged her to keep up with it. We discussed the importance of changing the exercise program in intensity. With respect to her lower back discomfort we discussed the importance of stretching and perhaps trying ice as opposed to heat.  With respect to the 2 episodes of tunnel vision and sweating after eating, it appears this is dietary related but taking in too much carbs which causes a spike in insulin levels causing hypoglycemia. We discussed how the majority of time this is due to carbohydrate ingestion. She was instructed to contact the office should she have any additional episodes. If she does we'll need to get her into the nutritionist. We also set her up to get her metabolic panel and B1 levels drawn. Followup 1 year  Mary Sellaric M. Andrey CampanileWilson, MD, FACS General, Bariatric, & Minimally Invasive Surgery [facility], [place]

## 2014-04-01 NOTE — Patient Instructions (Signed)
Keep up good work!! Watch carbohydrate intake - which can cause your insulin level to increase which will cause your blood sugar level to drop You need at least 1100 calories daily- you can play around with 1100 to 1300 calories and see if that helps with your hunger Keep changing up the pace in the gym Try ice for your back and do stretches

## 2014-04-07 LAB — VITAMIN B1: VITAMIN B1 (THIAMINE): 10 nmol/L (ref 8–30)

## 2014-07-15 ENCOUNTER — Ambulatory Visit: Payer: BC Managed Care – PPO | Admitting: Gynecology

## 2014-10-24 ENCOUNTER — Encounter (INDEPENDENT_AMBULATORY_CARE_PROVIDER_SITE_OTHER): Payer: Self-pay | Admitting: General Surgery

## 2015-04-27 ENCOUNTER — Other Ambulatory Visit: Payer: Self-pay | Admitting: Family Medicine

## 2015-04-27 DIAGNOSIS — N6320 Unspecified lump in the left breast, unspecified quadrant: Secondary | ICD-10-CM

## 2015-05-03 ENCOUNTER — Other Ambulatory Visit: Payer: Self-pay

## 2015-05-16 ENCOUNTER — Ambulatory Visit
Admission: RE | Admit: 2015-05-16 | Discharge: 2015-05-16 | Disposition: A | Discharge status: Home / Self Care | Payer: BLUE CROSS/BLUE SHIELD | Source: Ambulatory Visit | Attending: Family Medicine | Admitting: Family Medicine

## 2015-05-16 DIAGNOSIS — N6320 Unspecified lump in the left breast, unspecified quadrant: Secondary | ICD-10-CM

## 2015-12-24 LAB — HM PAP SMEAR: HM Pap smear: NORMAL

## 2016-07-01 DIAGNOSIS — F419 Anxiety disorder, unspecified: Secondary | ICD-10-CM | POA: Diagnosis not present

## 2016-08-08 ENCOUNTER — Other Ambulatory Visit: Payer: Self-pay | Admitting: Family Medicine

## 2016-08-08 DIAGNOSIS — N63 Unspecified lump in unspecified breast: Secondary | ICD-10-CM

## 2016-09-24 ENCOUNTER — Encounter (HOSPITAL_COMMUNITY): Payer: Self-pay

## 2016-12-03 DIAGNOSIS — M9903 Segmental and somatic dysfunction of lumbar region: Secondary | ICD-10-CM | POA: Diagnosis not present

## 2016-12-03 DIAGNOSIS — M9905 Segmental and somatic dysfunction of pelvic region: Secondary | ICD-10-CM | POA: Diagnosis not present

## 2016-12-03 DIAGNOSIS — M9902 Segmental and somatic dysfunction of thoracic region: Secondary | ICD-10-CM | POA: Diagnosis not present

## 2016-12-03 DIAGNOSIS — M722 Plantar fascial fibromatosis: Secondary | ICD-10-CM | POA: Diagnosis not present

## 2016-12-11 DIAGNOSIS — M722 Plantar fascial fibromatosis: Secondary | ICD-10-CM | POA: Diagnosis not present

## 2016-12-11 DIAGNOSIS — M9905 Segmental and somatic dysfunction of pelvic region: Secondary | ICD-10-CM | POA: Diagnosis not present

## 2016-12-11 DIAGNOSIS — M9903 Segmental and somatic dysfunction of lumbar region: Secondary | ICD-10-CM | POA: Diagnosis not present

## 2016-12-11 DIAGNOSIS — M9902 Segmental and somatic dysfunction of thoracic region: Secondary | ICD-10-CM | POA: Diagnosis not present

## 2017-01-02 DIAGNOSIS — M9905 Segmental and somatic dysfunction of pelvic region: Secondary | ICD-10-CM | POA: Diagnosis not present

## 2017-01-02 DIAGNOSIS — M9902 Segmental and somatic dysfunction of thoracic region: Secondary | ICD-10-CM | POA: Diagnosis not present

## 2017-01-02 DIAGNOSIS — M62830 Muscle spasm of back: Secondary | ICD-10-CM | POA: Diagnosis not present

## 2017-01-02 DIAGNOSIS — M722 Plantar fascial fibromatosis: Secondary | ICD-10-CM | POA: Diagnosis not present

## 2017-01-02 DIAGNOSIS — M9903 Segmental and somatic dysfunction of lumbar region: Secondary | ICD-10-CM | POA: Diagnosis not present

## 2017-03-06 DIAGNOSIS — M722 Plantar fascial fibromatosis: Secondary | ICD-10-CM | POA: Diagnosis not present

## 2017-03-06 DIAGNOSIS — M9905 Segmental and somatic dysfunction of pelvic region: Secondary | ICD-10-CM | POA: Diagnosis not present

## 2017-03-06 DIAGNOSIS — M62830 Muscle spasm of back: Secondary | ICD-10-CM | POA: Diagnosis not present

## 2017-03-06 DIAGNOSIS — M9903 Segmental and somatic dysfunction of lumbar region: Secondary | ICD-10-CM | POA: Diagnosis not present

## 2017-03-06 DIAGNOSIS — M9902 Segmental and somatic dysfunction of thoracic region: Secondary | ICD-10-CM | POA: Diagnosis not present

## 2017-03-18 DIAGNOSIS — M722 Plantar fascial fibromatosis: Secondary | ICD-10-CM | POA: Diagnosis not present

## 2017-03-18 DIAGNOSIS — M62830 Muscle spasm of back: Secondary | ICD-10-CM | POA: Diagnosis not present

## 2017-03-18 DIAGNOSIS — M9905 Segmental and somatic dysfunction of pelvic region: Secondary | ICD-10-CM | POA: Diagnosis not present

## 2017-03-18 DIAGNOSIS — M9902 Segmental and somatic dysfunction of thoracic region: Secondary | ICD-10-CM | POA: Diagnosis not present

## 2017-03-18 DIAGNOSIS — M9903 Segmental and somatic dysfunction of lumbar region: Secondary | ICD-10-CM | POA: Diagnosis not present

## 2017-04-08 DIAGNOSIS — M62830 Muscle spasm of back: Secondary | ICD-10-CM | POA: Diagnosis not present

## 2017-04-08 DIAGNOSIS — M9905 Segmental and somatic dysfunction of pelvic region: Secondary | ICD-10-CM | POA: Diagnosis not present

## 2017-04-08 DIAGNOSIS — M9903 Segmental and somatic dysfunction of lumbar region: Secondary | ICD-10-CM | POA: Diagnosis not present

## 2017-04-08 DIAGNOSIS — M722 Plantar fascial fibromatosis: Secondary | ICD-10-CM | POA: Diagnosis not present

## 2017-04-08 DIAGNOSIS — M9902 Segmental and somatic dysfunction of thoracic region: Secondary | ICD-10-CM | POA: Diagnosis not present

## 2017-04-11 DIAGNOSIS — M9905 Segmental and somatic dysfunction of pelvic region: Secondary | ICD-10-CM | POA: Diagnosis not present

## 2017-04-11 DIAGNOSIS — M9902 Segmental and somatic dysfunction of thoracic region: Secondary | ICD-10-CM | POA: Diagnosis not present

## 2017-04-11 DIAGNOSIS — M9903 Segmental and somatic dysfunction of lumbar region: Secondary | ICD-10-CM | POA: Diagnosis not present

## 2017-04-11 DIAGNOSIS — M722 Plantar fascial fibromatosis: Secondary | ICD-10-CM | POA: Diagnosis not present

## 2017-04-11 DIAGNOSIS — M62830 Muscle spasm of back: Secondary | ICD-10-CM | POA: Diagnosis not present

## 2017-04-15 DIAGNOSIS — M9902 Segmental and somatic dysfunction of thoracic region: Secondary | ICD-10-CM | POA: Diagnosis not present

## 2017-04-15 DIAGNOSIS — M62830 Muscle spasm of back: Secondary | ICD-10-CM | POA: Diagnosis not present

## 2017-04-15 DIAGNOSIS — M9903 Segmental and somatic dysfunction of lumbar region: Secondary | ICD-10-CM | POA: Diagnosis not present

## 2017-04-15 DIAGNOSIS — M9905 Segmental and somatic dysfunction of pelvic region: Secondary | ICD-10-CM | POA: Diagnosis not present

## 2017-04-15 DIAGNOSIS — M722 Plantar fascial fibromatosis: Secondary | ICD-10-CM | POA: Diagnosis not present

## 2017-05-28 ENCOUNTER — Other Ambulatory Visit: Payer: Self-pay | Admitting: Family Medicine

## 2017-05-28 DIAGNOSIS — N63 Unspecified lump in unspecified breast: Secondary | ICD-10-CM

## 2017-05-29 DIAGNOSIS — M7661 Achilles tendinitis, right leg: Secondary | ICD-10-CM | POA: Diagnosis not present

## 2017-06-06 ENCOUNTER — Other Ambulatory Visit: Payer: BLUE CROSS/BLUE SHIELD

## 2017-06-18 DIAGNOSIS — M79671 Pain in right foot: Secondary | ICD-10-CM | POA: Diagnosis not present

## 2017-06-20 DIAGNOSIS — M7661 Achilles tendinitis, right leg: Secondary | ICD-10-CM | POA: Diagnosis not present

## 2017-07-01 DIAGNOSIS — R262 Difficulty in walking, not elsewhere classified: Secondary | ICD-10-CM | POA: Diagnosis not present

## 2017-07-01 DIAGNOSIS — M7661 Achilles tendinitis, right leg: Secondary | ICD-10-CM | POA: Diagnosis not present

## 2017-07-01 DIAGNOSIS — M25571 Pain in right ankle and joints of right foot: Secondary | ICD-10-CM | POA: Diagnosis not present

## 2017-07-01 DIAGNOSIS — R531 Weakness: Secondary | ICD-10-CM | POA: Diagnosis not present

## 2017-07-08 DIAGNOSIS — R531 Weakness: Secondary | ICD-10-CM | POA: Diagnosis not present

## 2017-07-08 DIAGNOSIS — M7661 Achilles tendinitis, right leg: Secondary | ICD-10-CM | POA: Diagnosis not present

## 2017-07-08 DIAGNOSIS — M25571 Pain in right ankle and joints of right foot: Secondary | ICD-10-CM | POA: Diagnosis not present

## 2017-07-08 DIAGNOSIS — R262 Difficulty in walking, not elsewhere classified: Secondary | ICD-10-CM | POA: Diagnosis not present

## 2017-07-09 DIAGNOSIS — M7661 Achilles tendinitis, right leg: Secondary | ICD-10-CM | POA: Diagnosis not present

## 2017-07-09 DIAGNOSIS — R531 Weakness: Secondary | ICD-10-CM | POA: Diagnosis not present

## 2017-07-09 DIAGNOSIS — R262 Difficulty in walking, not elsewhere classified: Secondary | ICD-10-CM | POA: Diagnosis not present

## 2017-07-09 DIAGNOSIS — M25571 Pain in right ankle and joints of right foot: Secondary | ICD-10-CM | POA: Diagnosis not present

## 2017-07-15 DIAGNOSIS — M7661 Achilles tendinitis, right leg: Secondary | ICD-10-CM | POA: Diagnosis not present

## 2017-07-15 DIAGNOSIS — M25571 Pain in right ankle and joints of right foot: Secondary | ICD-10-CM | POA: Diagnosis not present

## 2017-07-15 DIAGNOSIS — R262 Difficulty in walking, not elsewhere classified: Secondary | ICD-10-CM | POA: Diagnosis not present

## 2017-07-15 DIAGNOSIS — R531 Weakness: Secondary | ICD-10-CM | POA: Diagnosis not present

## 2017-07-25 ENCOUNTER — Other Ambulatory Visit: Payer: Self-pay | Admitting: Nurse Practitioner

## 2017-07-25 DIAGNOSIS — N63 Unspecified lump in unspecified breast: Secondary | ICD-10-CM

## 2017-07-25 DIAGNOSIS — N6002 Solitary cyst of left breast: Secondary | ICD-10-CM

## 2017-07-25 HISTORY — DX: Solitary cyst of left breast: N60.02

## 2017-07-25 LAB — HM MAMMOGRAPHY: HM Mammogram: NORMAL (ref 0–4)

## 2017-07-28 ENCOUNTER — Ambulatory Visit
Admission: RE | Admit: 2017-07-28 | Discharge: 2017-07-28 | Disposition: A | Discharge status: Home / Self Care | Payer: BLUE CROSS/BLUE SHIELD | Source: Ambulatory Visit | Attending: Family Medicine | Admitting: Family Medicine

## 2017-07-28 DIAGNOSIS — R922 Inconclusive mammogram: Secondary | ICD-10-CM | POA: Diagnosis not present

## 2017-07-28 DIAGNOSIS — N63 Unspecified lump in unspecified breast: Secondary | ICD-10-CM

## 2017-07-28 DIAGNOSIS — N6489 Other specified disorders of breast: Secondary | ICD-10-CM | POA: Diagnosis not present

## 2017-08-08 ENCOUNTER — Ambulatory Visit (INDEPENDENT_AMBULATORY_CARE_PROVIDER_SITE_OTHER): Payer: BLUE CROSS/BLUE SHIELD | Admitting: Family

## 2017-08-08 ENCOUNTER — Encounter: Payer: Self-pay | Admitting: Family

## 2017-08-08 DIAGNOSIS — G4700 Insomnia, unspecified: Secondary | ICD-10-CM | POA: Insufficient documentation

## 2017-08-08 DIAGNOSIS — F418 Other specified anxiety disorders: Secondary | ICD-10-CM

## 2017-08-08 DIAGNOSIS — F429 Obsessive-compulsive disorder, unspecified: Secondary | ICD-10-CM | POA: Diagnosis not present

## 2017-08-08 MED ORDER — SCOPOLAMINE 1 MG/3DAYS TD PT72: 1.0000 | MEDICATED_PATCH | TRANSDERMAL | 0 refills | Status: DC

## 2017-08-08 MED ORDER — FLUOXETINE HCL 40 MG PO CAPS: 40.0000 mg | ORAL_CAPSULE | Freq: Every morning | ORAL | 1 refills | Status: DC

## 2017-08-08 NOTE — Progress Notes (Signed)
Subjective:    Patient ID: Opalene Neeb, female    DOB: 1976/06/20, 41 y.o.   MRN: [id number]  HPI  Ms. Vasudevan is a 41 yr old female who presents today to establish care.   pmhx is significant for:  Depression/Anxiety- started in her mid 20's. Had a rough first marriage.  Anxiety worsened in 2003 after birth of her daughter. Daugher has adhd.  She is on clonazepam which helps some.  Has panic attacks intermittently.  Reports that her panic attacks are very circumstanstial.  She continues prozac. Reports sister had cabg x 3.  She is having some heel pain, saw ortho, has a small born spur so she is not exercising. Saw PT. Reports that she uses clonazepam on a prn basis.    OCD- was worse prior to prozac. Has been on Prozac for 4-5 years and reports that this is stable.   Insomnia- trouble falling asleep, trouble staying asleep. Long standing issue for her .  Reports that she continues to work on good sleep hygiene.  Just feels like she can't turn off her braing.   Hx of gastric bypass- 2013, max weight 250's.  Has a sister who is 2 inches shorter than her and weighs >400 Wt Readings from Last 3 Encounters:  04/01/14 173 lb 9.6 oz (78.7 kg)  07/15/13 168 lb 8 oz (76.4 kg)  07/12/13 167 lb (75.8 kg)     Review of Systems  Constitutional:       Has gained 10-20 pounds in the last 6 mos  HENT: Negative for hearing loss.   Eyes: Negative for visual disturbance.  Respiratory: Negative for cough.   Cardiovascular:       Occasional ankle edema   Gastrointestinal: Negative for blood in stool, constipation and diarrhea.  Genitourinary: Negative for dysuria, frequency and hematuria.  Musculoskeletal: Negative for back pain.  Skin:       Small skin patch on left forearm  Neurological: Negative for headaches.  Hematological: Negative for adenopathy.  Psychiatric/Behavioral:       See HPI     Past Medical History:  Diagnosis Date  . Anemia   . GERD (gastroesophageal reflux disease)   .  Irritable bowel syndrome   . OCD (obsessive compulsive disorder)   . Panic attack      Social History   Social History  . Marital status: Married    Spouse name: N/A  . Number of children: N/A  . Years of education: N/A   Occupational History  . Not on file.   Social History Main Topics  . Smoking status: Never Smoker  . Smokeless tobacco: Never Used  . Alcohol use 0.0 oz/week     Comment: 3 glasses wine friday and 3 glasses on saturday  . Drug use: No  . Sexual activity: Yes    Partners: Male    Birth control/ protection: Surgical     Comment: ablation   Other Topics Concern  . Not on file   Social History Narrative   Married   Works in Counselling psychologist work at [employer]   3 biological children   1996- son Ree Kida   2000- [name] son   2003-[name]      2 children "non-official foster children" teens, live with her   Enjoys watching netflix, movies, spending time with her children, going out to eat   Completed a masters in Museum/gallery curator    Past Surgical History:  Procedure Laterality Date  . ENDOMETRIAL ABLATION    .  GASTRIC ROUX-EN-Y  04/20/2012   Procedure: LAPAROSCOPIC ROUX-EN-Y GASTRIC BYPASS WITH UPPER ENDOSCOPY;  Surgeon: Atilano Ina, MD,FACS;  Location: [facility];  Service: General;;  . MYOMECTOMY    . TUBAL LIGATION  01/27/2002    Family History  Problem Relation Age of Onset  . Heart disease Mother   . Diabetes Mother   . Depression Mother        committed suicide at age 76  . Alcohol abuse Father        died from murder  . Cancer Paternal Grandmother        colon  . Diabetes Sister   . CAD Sister        s/p bypass,   . Obesity Sister        weighs 400 lbs    Allergies  Allergen Reactions  . Prednisone Other (See Comments)    Psychological rxn: pt reports "losing time"    Current Outpatient Prescriptions on File Prior to Visit  Medication Sig Dispense Refill  . Calcium-Vitamin D-Vitamin K (CALCIUM SOFT CHEWS PO) Take 2 each by mouth 3 (three) times  daily.     . clonazePAM (KLONOPIN) 0.5 MG tablet Take 0.5 mg by mouth 2 (two) times daily as needed. anxiety    . flintstones complete (FLINTSTONES) 60 MG chewable tablet Chew 2 tablets by mouth daily.     No current facility-administered medications on file prior to visit.     There were no vitals taken for this visit.      Objective:   Physical Exam  Constitutional: She is oriented to person, place, and time. She appears well-developed and well-nourished.  HENT:  Head: Normocephalic and atraumatic.  Right Ear: Tympanic membrane and ear canal normal.  Left Ear: Tympanic membrane and ear canal normal.  Eyes: Pupils are equal, round, and reactive to light. No scleral icterus.  Cardiovascular: Normal rate, regular rhythm and normal heart sounds.   No murmur heard. Pulmonary/Chest: Effort normal and breath sounds normal. No respiratory distress. She has no wheezes.  Musculoskeletal: She exhibits no edema.  Lymphadenopathy:    She has no cervical adenopathy.  Neurological: She is alert and oriented to person, place, and time.  Psychiatric: She has a normal mood and affect. Her behavior is normal. Judgment and thought content normal.          Assessment & Plan:

## 2017-08-08 NOTE — Assessment & Plan Note (Signed)
Currently stable on prozac with as needed clonazepam. A controlled substance contract is signed today and a UDS will be obtained.

## 2017-08-08 NOTE — Assessment & Plan Note (Signed)
Stable on prozac, continue same. 

## 2017-10-15 ENCOUNTER — Ambulatory Visit (INDEPENDENT_AMBULATORY_CARE_PROVIDER_SITE_OTHER): Payer: BLUE CROSS/BLUE SHIELD | Admitting: Family

## 2017-10-15 ENCOUNTER — Encounter: Payer: Self-pay | Admitting: Family

## 2017-10-15 VITALS — BP 146/100 | HR 80 | Temp 98.3°F | Resp 16 | Ht 63.0 in | Wt 198.0 lb

## 2017-10-15 DIAGNOSIS — Z23 Encounter for immunization: Secondary | ICD-10-CM | POA: Diagnosis not present

## 2017-10-15 MED ORDER — BETAMETHASONE DIPROPIONATE 0.05 % EX CREA: TOPICAL_CREAM | Freq: Two times a day (BID) | CUTANEOUS | 0 refills | Status: DC

## 2017-10-15 NOTE — Progress Notes (Signed)
Subjective:    Patient ID: Sandra Nichols, female    DOB: Aug 30, 1976, 41 y.o.   MRN: [id number]  HPI  Patient is a 41 year old female who presents today with chief complaint of rash.  She reports intermittent raised red pruritic hive-like bumps on her skin.  She has been using Benadryl cream and oral Benadryl with minimal relief.Review of Systems    See HPI  Past Medical History:  Diagnosis Date  . Anemia   . Breast cyst, left 07/25/2017   3 cysts. Resume screening in 1 yr per pt.  . Depression   . GERD (gastroesophageal reflux disease)   . Irritable bowel syndrome   . OCD (obsessive compulsive disorder)   . Panic attack      Social History   Social History  . Marital status: Married    Spouse name: N/A  . Number of children: N/A  . Years of education: N/A   Occupational History  . Not on file.   Social History Main Topics  . Smoking status: Never Smoker  . Smokeless tobacco: Never Used  . Alcohol use 0.0 oz/week     Comment: 1 1/2 bottles of wine per weekend.  . Drug use: No  . Sexual activity: Yes    Partners: Male    Birth control/ protection: Surgical     Comment: ablation   Other Topics Concern  . Not on file   Social History Narrative   Married   Works in Counselling psychologistT desk work at [employer]   3 biological children   1996- son Ree KidaJack   2000- [name] son   2003-[name]      2 children "non-official foster children" teens, live with her   Enjoys watching netflix, movies, spending time with her children, going out to eat   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    Past Surgical History:  Procedure Laterality Date  . ENDOMETRIAL ABLATION    . GASTRIC ROUX-EN-Y  04/20/2012   Procedure: LAPAROSCOPIC ROUX-EN-Y GASTRIC BYPASS WITH UPPER ENDOSCOPY;  Surgeon: Atilano InaEric M Wilson, MD,FACS;  Location: [facility];  Service: General;;  . LASIK Bilateral 2015  . MYOMECTOMY    . TUBAL LIGATION  01/27/2002    Family History  Problem Relation Age of Onset  . Heart disease Mother   . Diabetes  Mother   . Depression Mother        committed suicide at age 43  . Alcohol abuse Mother   . Hypertension Mother   . Alcohol abuse Father        died from murder  . Cancer Paternal Grandmother        colon  . Diabetes Sister   . CAD Sister        s/p bypass,   . Obesity Sister        weighs 400 lbs  . Heart disease Maternal Grandfather     Allergies  Allergen Reactions  . Epinephrine Other (See Comments) and Palpitations    syncope  . Prednisone Other (See Comments)    Psychological rxn: pt reports "losing time"  . Influenza Vac Subunit Quad     Localized swelling at injection site. States she has tolerated flu mist in the past.    Current Outpatient Prescriptions on File Prior to Visit  Medication Sig Dispense Refill  . clonazePAM (KLONOPIN) 0.5 MG tablet Take 0.5 mg by mouth 2 (two) times daily as needed. anxiety    . FLUoxetine (PROZAC) 40 MG capsule Take 1 capsule (40 mg  total) by mouth every morning. 90 capsule 1   No current facility-administered medications on file prior to visit.     BP (!) 146/100 (BP Location: Right Arm, Cuff Size: Large)   Pulse 80   Temp 98.3 F (36.8 C) (Oral)   Resp 16   Ht 5\' 3"  (1.6 m)   Wt 198 lb (89.8 kg)   SpO2 99%   BMI 35.07 kg/m    Objective:   Physical Exam  Constitutional: She is oriented to person, place, and time. She appears well-developed and well-nourished. No distress.  Neurological: She is alert and oriented to person, place, and time.  Skin: Skin is warm and dry.  Multiple erythematous raised lesions noted on arms and legs  Psychiatric: She has a normal mood and affect. Her behavior is normal. Judgment and thought content normal.          Assessment & Plan:  Urticaria-we will prescribe topical Diprolene.  She cannot take oral steroids due to episode as a teenager of adverse reaction of apparently losing a period of time mentally well on the medication.  I think it is reasonable to try topical.  She is to let  me know if she has any issues using the topical.  Unfortunately I think she would benefit from oral but we cannot use it safely.  She is advised to add Zyrtec once daily.  Patient is also advised as follows: Eliminate scent from your fabric detergent.  You can try ALL free and clear. Apply a good moisturizer daily such as Cetaphil. Call if new or worsening rash or if symptoms fail to improve with above measures.  Elevated blood pressure-we will have patient follow-up in 2 weeks for blood pressure recheck.  FluMist today.

## 2017-10-15 NOTE — Patient Instructions (Signed)
Please begin zyrtec once daily.  You can apply to Diprolene cream twice daily to affected areas. Eliminate scent from your fabric detergent.  You can try ALL free and clear. Apply a good moisturizer daily such as Cetaphil. Call if new or worsening rash or if symptoms fail to improve with above measures.

## 2017-10-16 ENCOUNTER — Telehealth: Payer: Self-pay | Admitting: Family

## 2017-10-16 NOTE — Telephone Encounter (Signed)
Please contact patient and let her know that I reviewed her last blood pressure and it was elevated at her appointment.  I would like for her to return in 2 weeks to see the RN for blood pressure recheck please.

## 2017-10-17 NOTE — Telephone Encounter (Signed)
Called patient left message on answering machine for her to call back and schedule NV appointment for a BP check per M. O'sullivan.

## 2017-11-11 ENCOUNTER — Encounter (HOSPITAL_COMMUNITY): Payer: Self-pay

## 2017-12-08 ENCOUNTER — Ambulatory Visit (INDEPENDENT_AMBULATORY_CARE_PROVIDER_SITE_OTHER): Payer: BLUE CROSS/BLUE SHIELD | Admitting: Family

## 2017-12-08 ENCOUNTER — Encounter: Payer: Self-pay | Admitting: Family

## 2017-12-08 VITALS — BP 158/100 | HR 71 | Temp 98.4°F | Resp 16 | Ht 63.0 in | Wt 198.6 lb

## 2017-12-08 DIAGNOSIS — Z23 Encounter for immunization: Secondary | ICD-10-CM

## 2017-12-08 DIAGNOSIS — I10 Essential (primary) hypertension: Secondary | ICD-10-CM

## 2017-12-08 DIAGNOSIS — M25521 Pain in right elbow: Secondary | ICD-10-CM | POA: Diagnosis not present

## 2017-12-08 DIAGNOSIS — Z0000 Encounter for general adult medical examination without abnormal findings: Secondary | ICD-10-CM

## 2017-12-08 MED ORDER — CLOTRIMAZOLE-BETAMETHASONE 1-0.05 % EX CREA: 1.0000 "application " | TOPICAL_CREAM | Freq: Two times a day (BID) | CUTANEOUS | 0 refills | Status: DC

## 2017-12-08 MED ORDER — AMLODIPINE BESYLATE 5 MG PO TABS: 5.0000 mg | ORAL_TABLET | Freq: Every day | ORAL | 3 refills | Status: DC

## 2017-12-08 NOTE — Patient Instructions (Addendum)
Please begin lotrisone cream twice daily to spot on your left forearm. Let me know if it is not improved in 2 weeks. You will be contacted about your referral to Sports medicine for your elbow.  Continue to work on [employer] Grouphealthy diet, regular exercise and weight loss.   For blood pressure, please begin amlodipine 5mg  once daily and work on low sodium diet.

## 2017-12-08 NOTE — Progress Notes (Deleted)
   Subjective:    Patient ID: Sandra Nichols, female    DOB: 06/18/1976, 41 y.o.   MRN: [id number]  HPI  Ms. Sandra Nichols is a 41 yr old female who presents today for cpx.  Patient presents today for complete physical.  Immunizations: Diet: Exercise: Pap Smear: Mammogram:     Review of Systems     Objective:   Physical Exam        Assessment & Plan:  EKG tracing is personally reviewed.  EKG notes NSR.  No acute changes.

## 2017-12-09 ENCOUNTER — Encounter: Payer: Self-pay | Admitting: Family

## 2017-12-09 LAB — URINALYSIS, ROUTINE W REFLEX MICROSCOPIC
BILIRUBIN URINE: NEGATIVE
Leukocytes, UA: NEGATIVE
Nitrite: NEGATIVE
Specific Gravity, Urine: >=1.03 — AB (ref 1.000–1.030)
URINE GLUCOSE: NEGATIVE
Urobilinogen, UA: 1 (ref 0.0–1.0)
pH: 5.5 (ref 5.0–8.0)

## 2017-12-09 LAB — LIPID PANEL
CHOL/HDL RATIO: 2
Cholesterol: 186 mg/dL (ref 0–200)
HDL: 103.6 mg/dL (ref >39.00)
LDL CALC: 67 mg/dL (ref 0–99)
NONHDL: 82.46
Triglycerides: 76 mg/dL (ref 0.0–149.0)
VLDL: 15.2 mg/dL (ref 0.0–40.0)

## 2017-12-09 LAB — CBC WITH DIFFERENTIAL/PLATELET
Basophils Absolute: 0 10*3/uL (ref 0.0–0.1)
Basophils Relative: 0.6 % (ref 0.0–3.0)
EOS ABS: 0 10*3/uL (ref 0.0–0.7)
Eosinophils Relative: 0.4 % (ref 0.0–5.0)
HCT: 38.8 % (ref 36.0–46.0)
HEMOGLOBIN: 13.1 g/dL (ref 12.0–15.0)
LYMPHS PCT: 16.9 % (ref 12.0–46.0)
Lymphs Abs: 1.2 10*3/uL (ref 0.7–4.0)
MCHC: 33.8 g/dL (ref 30.0–36.0)
MCV: 93 fl (ref 78.0–100.0)
MONO ABS: 0.4 10*3/uL (ref 0.1–1.0)
Monocytes Relative: 5.3 % (ref 3.0–12.0)
Neutro Abs: 5.6 10*3/uL (ref 1.4–7.7)
Neutrophils Relative %: 76.8 % (ref 43.0–77.0)
Platelets: 289 10*3/uL (ref 150.0–400.0)
RBC: 4.17 Mil/uL (ref 3.87–5.11)
RDW: 13.9 % (ref 11.5–15.5)
WBC: 7.3 10*3/uL (ref 4.0–10.5)

## 2017-12-09 LAB — BASIC METABOLIC PANEL
BUN: 17 mg/dL (ref 6–23)
CO2: 26 mEq/L (ref 19–32)
CREATININE: 0.7 mg/dL (ref 0.40–1.20)
Calcium: 8.9 mg/dL (ref 8.4–10.5)
Chloride: 98 mEq/L (ref 96–112)
GFR: 96.69 mL/min (ref >60.00)
Glucose, Bld: 83 mg/dL (ref 70–99)
Potassium: 4 mEq/L (ref 3.5–5.1)
Sodium: 134 mEq/L — ABNORMAL LOW (ref 135–145)

## 2017-12-09 LAB — TSH: TSH: 2.31 u[IU]/mL (ref 0.35–4.50)

## 2017-12-09 LAB — HEPATIC FUNCTION PANEL
ALT: 19 U/L (ref 0–35)
AST: 25 U/L (ref 0–37)
Albumin: 4.2 g/dL (ref 3.5–5.2)
Alkaline Phosphatase: 97 U/L (ref 39–117)
BILIRUBIN DIRECT: 0.1 mg/dL (ref 0.0–0.3)
BILIRUBIN TOTAL: 0.7 mg/dL (ref 0.2–1.2)
Total Protein: 7.8 g/dL (ref 6.0–8.3)

## 2017-12-10 ENCOUNTER — Ambulatory Visit: Payer: BLUE CROSS/BLUE SHIELD | Admitting: Family Medicine

## 2017-12-10 ENCOUNTER — Encounter: Payer: Self-pay | Admitting: Family Medicine

## 2017-12-10 DIAGNOSIS — M7711 Lateral epicondylitis, right elbow: Secondary | ICD-10-CM | POA: Diagnosis not present

## 2017-12-10 NOTE — Patient Instructions (Signed)
You have lateral epicondylitis Try to avoid painful activities as much as possible. Ice the area 3-4 times a day for 15 minutes at a time. Voltaren gel up to 4 times a day topically - let me know if you need a refill for this. Counterforce brace or sleeve as directed can help unload area - wear this regularly if it provides you with relief. Hammer rotation exercise, wrist extension exercise with 1 pound weight - 3 sets of 10 once a day.   Stretching - hold for 20-30 seconds and repeat 3 times. Consider physical therapy, injection, nitro patches if not improving. Follow up in 6 weeks.

## 2017-12-11 ENCOUNTER — Encounter: Payer: Self-pay | Admitting: Family Medicine

## 2017-12-11 DIAGNOSIS — I10 Essential (primary) hypertension: Secondary | ICD-10-CM | POA: Insufficient documentation

## 2017-12-11 DIAGNOSIS — M7711 Lateral epicondylitis, right elbow: Secondary | ICD-10-CM | POA: Insufficient documentation

## 2017-12-11 NOTE — Progress Notes (Signed)
Subjective:  Patient ID: Sandra Nichols, female DOB: April 04, 1976, 41 y.o. MRN: [id number]  HPI  Sandra Nichols is a 41 yr old female who presents today for cpx.  Patient presents today for complete physical.  Immunizations:  Diet: not eating healthy  Exercise: Not exercising regularly     Wt Readings from Last 3 Encounters:  12/08/17 198 lb 9.6 oz (90.1 kg)  10/15/17 198 lb (89.8 kg)  08/08/17 201 lb 12.8 oz (91.5 kg)  Pap Smear: 12/24/15  Mammogram: 08/08/17  Skin lesion- reports lesion on left forearm "for a while."     BP Readings from Last 3 Encounters:  12/08/17 (!) 158/100  10/15/17 (!) 146/100  08/08/17 130/90   Review of Systems  Constitutional: Negative for unexpected weight change.  HENT: Negative for hearing loss and rhinorrhea.  Eyes: Negative for visual disturbance.  Respiratory: Negative for cough.  Cardiovascular: Negative for leg swelling.  Gastrointestinal: Negative for blood in stool, constipation and diarrhea.  Genitourinary: Negative for dysuria and frequency.  Musculoskeletal:  Right elbow pain  Skin: Positive for rash.  Lesion left forearm  Neurological: Negative for headaches.  Hematological: Negative for adenopathy.  Psychiatric/Behavioral:  Denies depression/anxiety      Past Medical History:  Diagnosis Date  . Anemia   . Breast cyst, left 07/25/2017   3 cysts. Resume screening in 1 yr per pt.  . Depression   . GERD (gastroesophageal reflux disease)   . Irritable bowel syndrome   . OCD (obsessive compulsive disorder)   . Panic attack    Social History        Socioeconomic History  . Marital status: Married    Spouse name: Not on file  . Number of children: Not on file  . Years of education: Not on file  . Highest education level: Not on file  Social Needs  . Financial resource strain: Not on file  . Food insecurity - worry: Not on file  . Food insecurity - inability: Not on file  . Transportation needs - medical: Not on file  . Transportation  needs - non-medical: Not on file  Occupational History  . Not on file  Tobacco Use  . Smoking status: Never Smoker  . Smokeless tobacco: Never Used  Substance and Sexual Activity  . Alcohol use: Yes    Alcohol/week: 0.0 oz    Comment: 1 1/2 bottles of wine per weekend.  . Drug use: No  . Sexual activity: Yes    Partners: Male    Birth control/protection: Surgical    Comment: ablation  Other Topics Concern  . Not on file  Social History Narrative   Married   Works in Counselling psychologistT desk work at [employer]   3 biological children   1996- son Ree KidaJack   2000- [name] son   2003-[name]      2 children "non-official foster children" teens, live with her   Enjoys watching netflix, movies, spending time with her children, going out to eat   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        Past Surgical History:  Procedure Laterality Date  . ENDOMETRIAL ABLATION    . GASTRIC ROUX-EN-Y  04/20/2012   Procedure: LAPAROSCOPIC ROUX-EN-Y GASTRIC BYPASS WITH UPPER ENDOSCOPY; Surgeon: Atilano InaEric M Wilson, MD,FACS; Location: [facility]; Service: General;;  . LASIK Bilateral 2015  . MYOMECTOMY    . TUBAL LIGATION  01/27/2002        Family History  Problem Relation Age of Onset  . Heart disease Mother   .  Diabetes Mother   . Depression Mother    committed suicide at age 43  . Alcohol abuse Mother   . Hypertension Mother   . Alcohol abuse Father    died from murder  . Cancer Paternal Grandmother    colon  . Diabetes Sister   . CAD Sister    s/p bypass,   . Obesity Sister    weighs 400 lbs  . Heart disease Maternal Grandfather         Allergies  Allergen Reactions  . Epinephrine Other (See Comments) and Palpitations    syncope  . Prednisone Other (See Comments)    Psychological rxn: pt reports "losing time"  . Influenza Vac Subunit Quad     Localized swelling at injection site. States she has tolerated flu mist in the past.         Current Outpatient Medications on File Prior to Visit  Medication Sig  Dispense Refill  . betamethasone dipropionate (DIPROLENE) 0.05 % cream Apply topically 2 (two) times daily. 60 g 0  . FLUoxetine (PROZAC) 40 MG capsule Take 1 capsule (40 mg total) by mouth every morning. 90 capsule 1  . clonazePAM (KLONOPIN) 0.5 MG tablet Take 0.5 mg by mouth 2 (two) times daily as needed. anxiety     No current facility-administered medications on file prior to visit.    BP (!) 164/97 (BP Location: Right Arm, Cuff Size: Large)  Pulse 71  Temp 98.4 F (36.9 C) (Oral)  Resp 16  Ht 5\' 3"  (1.6 m)  Wt 198 lb 9.6 oz (90.1 kg)  SpO2 100%  BMI 35.18 kg/m   Objective:  Physical Exam  Physical Exam  Constitutional: She is oriented to person, place, and time. She appears well-developed and well-nourished. No distress.  HENT:  Head: Normocephalic and atraumatic.  Right Ear: Tympanic membrane and ear canal normal.  Left Ear: Tympanic membrane and ear canal normal.  Mouth/Throat: Oropharynx is clear and moist.  Eyes: Pupils are equal, round, and reactive to light. No scleral icterus.  Neck: Normal range of motion. No thyromegaly present.  Cardiovascular: Normal rate and regular rhythm.  No murmur heard.  Pulmonary/Chest: Effort normal and breath sounds normal. No respiratory distress. He has no wheezes. She has no rales. She exhibits no tenderness.  Abdominal: Soft. Bowel sounds are normal. She exhibits no distension and no mass. There is no tenderness. There is no rebound and no guarding.  Musculoskeletal: She exhibits no edema.  Lymphadenopathy:  She has no cervical adenopathy.  Neurological: She is alert and oriented to person, place, and time. She has normal patellar reflexes. She exhibits normal muscle tone. Coordination normal.  Skin: Skin is warm and dry.  Psychiatric: She has a normal mood and affect. Her behavior is normal. Judgment and thought content normal.  Breasts: Examined lying  Right: Without masses, retractions, discharge or axillary adenopathy.  Left:  Without masses, retractions, discharge or axillary adenopathy.  Pelvic: deferred   Assessment & Plan:   Assessment & Plan:  Preventative care- discussed healthy diet, regular exercise and weight loss.  Obtain routine lab work. Pap, mammo up to date. Hep A today- [place] in January.

## 2017-12-11 NOTE — Assessment & Plan Note (Signed)
New. Begin amlodipine. Discussed low sodium diet/exercise, weight loss.

## 2017-12-11 NOTE — Assessment & Plan Note (Signed)
Icing, voltaren gel.  counterforce brace.  Reviewed home exercise program to do daily.  Consider PT, injection, nitro patches if not improving.  F/u in 6 weeks.

## 2017-12-11 NOTE — Progress Notes (Signed)
PCP and consultation requested by: Sandra Nichols, Melissa, NP  Subjective:   HPI: Patient is a 41 y.o. female here for right elbow pain.  Patient denies known trauma or injury. She reports about 3 weeks ago she started to get lateral right elbow pain. Started during a work trip to [place] when she had to pick up her bag a lot. Has tried using voltaren topically with mild benefit. Pain is 2/10 but up to 6/10 and sharp at times. Worse with flexion and extension. No skin changes, numbness.  Past Medical History:  Diagnosis Date  . Anemia   . Breast cyst, left 07/25/2017   3 cysts. Resume screening in 1 yr per pt.  . Depression   . GERD (gastroesophageal reflux disease)   . Irritable bowel syndrome   . OCD (obsessive compulsive disorder)   . Panic attack     Current Outpatient Medications on File Prior to Visit  Medication Sig Dispense Refill  . amLODipine (NORVASC) 5 MG tablet Take 1 tablet (5 mg total) by mouth daily. 30 tablet 3  . betamethasone dipropionate (DIPROLENE) 0.05 % cream Apply topically 2 (two) times daily. 60 g 0  . clonazePAM (KLONOPIN) 0.5 MG tablet Take 0.5 mg by mouth 2 (two) times daily as needed. anxiety    . clotrimazole-betamethasone (LOTRISONE) cream Apply 1 application topically 2 (two) times daily. 15 g 0  . FLUoxetine (PROZAC) 40 MG capsule Take 1 capsule (40 mg total) by mouth every morning. 90 capsule 1   No current facility-administered medications on file prior to visit.     Past Surgical History:  Procedure Laterality Date  . ENDOMETRIAL ABLATION    . GASTRIC ROUX-EN-Y  04/20/2012   Procedure: LAPAROSCOPIC ROUX-EN-Y GASTRIC BYPASS WITH UPPER ENDOSCOPY;  Surgeon: Atilano InaEric M Wilson, MD,FACS;  Location: [facility];  Service: General;;  . LASIK Bilateral 2015  . MYOMECTOMY    . TUBAL LIGATION  01/27/2002    Allergies  Allergen Reactions  . Epinephrine Other (See Comments) and Palpitations    syncope  . Prednisone Other (See Comments)    Psychological  rxn: pt reports "losing time"  . Influenza Vac Subunit Quad     Localized swelling at injection site. States she has tolerated flu mist in the past.    Social History   Socioeconomic History  . Marital status: Married    Spouse name: Not on file  . Number of children: Not on file  . Years of education: Not on file  . Highest education level: Not on file  Social Needs  . Financial resource strain: Not on file  . Food insecurity - worry: Not on file  . Food insecurity - inability: Not on file  . Transportation needs - medical: Not on file  . Transportation needs - non-medical: Not on file  Occupational History  . Not on file  Tobacco Use  . Smoking status: Never Smoker  . Smokeless tobacco: Never Used  Substance and Sexual Activity  . Alcohol use: Yes    Alcohol/week: 0.0 oz    Comment: 1 1/2 bottles of wine per weekend.  . Drug use: No  . Sexual activity: Yes    Partners: Male    Birth control/protection: Surgical    Comment: ablation  Other Topics Concern  . Not on file  Social History Narrative   Married   Works in Counselling psychologistT desk work at [employer]   3 biological children   1996- son Sandra Nichols   2000- Sandra Nichols son   [name]  2 children "non-official foster children" teens, live with her   Enjoys watching netflix, movies, spending time with her children, going out to eat   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    Family History  Problem Relation Age of Onset  . Heart disease Mother   . Diabetes Mother   . Depression Mother        committed suicide at age 43  . Alcohol abuse Mother   . Hypertension Mother   . Alcohol abuse Father        died from murder  . Cancer Paternal Grandmother        colon  . Diabetes Sister   . CAD Sister        s/p bypass,   . Obesity Sister        weighs 400 lbs  . Heart disease Maternal Grandfather     BP 138/90   Pulse 74   Ht 5\' 3"  (1.6 m)   Wt 198 lb (89.8 kg)   BMI 35.07 kg/m   Review of Systems: See HPI above.      Objective:  Physical Exam:  Gen: NAD, comfortable in exam room  Right elbow: No gross deformity, swelling, bruising. TTP lateral epicondyle.  Minimal tenderness triceps tendon.  No other tenderness. FROM with 5/5 strength.  Pain with resisted wrist extension and supination.   Collateral ligaments intact. NVI distally.  Left elbow: No gross deformity. No TTP. FROM with 5/5 strength. Collateral ligaments intact. NVI distally.   Assessment & Plan:  1. Right lateral epicondylitis - Icing, voltaren gel.  counterforce brace.  Reviewed home exercise program to do daily.  Consider PT, injection, nitro patches if not improving.  F/u in 6 weeks.

## 2018-01-06 ENCOUNTER — Ambulatory Visit: Payer: BLUE CROSS/BLUE SHIELD | Admitting: Family

## 2018-01-06 ENCOUNTER — Encounter: Payer: Self-pay | Admitting: Family

## 2018-01-06 VITALS — BP 144/88 | HR 66 | Temp 98.2°F | Resp 16 | Ht 63.0 in | Wt 200.0 lb

## 2018-01-06 DIAGNOSIS — I10 Essential (primary) hypertension: Secondary | ICD-10-CM

## 2018-01-06 NOTE — Patient Instructions (Signed)
Stop mucinex D, you may use mucinex (plain) as needed. Continue amlodipine 5 mg.

## 2018-01-08 NOTE — Progress Notes (Signed)
Subjective:    Patient ID: Sandra Nichols, female    DOB: August 07, 1976, 42 y.o.   MRN: [id number]  HPI   Sandra Nichols is a 42 yr old female who presents today for follow up of her hypertension.  Last visit we started amlodipine 5mg .  She reports tolerating without difficulties.     BP Readings from Last 3 Encounters:  01/06/18 (!) 144/88  12/10/17 138/90  12/08/17 (!) 158/100              Review of Systems     Review of Systems See hpi         Past Medical History:  Diagnosis Date  . Anemia   . Breast cyst, left 07/25/2017   3 cysts. Resume screening in 1 yr per pt.  . Depression   . GERD (gastroesophageal reflux disease)   . Irritable bowel syndrome   . OCD (obsessive compulsive disorder)   . Panic attack      Social History        Socioeconomic History  . Marital status: Married    Spouse name: Not on file  . Number of children: Not on file  . Years of education: Not on file  . Highest education level: Not on file  Social Needs  . Financial resource strain: Not on file  . Food insecurity - worry: Not on file  . Food insecurity - inability: Not on file  . Transportation needs - medical: Not on file  . Transportation needs - non-medical: Not on file  Occupational History  . Not on file  Tobacco Use  . Smoking status: Never Smoker  . Smokeless tobacco: Never Used  Substance and Sexual Activity  . Alcohol use: Yes    Alcohol/week: 0.0 oz    Comment: 1 1/2 bottles of wine per weekend.  . Drug use: No  . Sexual activity: Yes    Partners: Male    Birth control/protection: Surgical    Comment: ablation  Other Topics Concern  . Not on file  Social History Narrative   Married   Works in Counselling psychologist work at [employer]   3 biological children   1996- son Sandra Nichols   2000- Sandra Nichols son   2003-Sandra Nichols      2 children "non-official foster children" teens, live with her   Enjoys watching netflix, movies, spending time with her  children, going out to eat   Completed a masters in Museum/gallery curator         Past Surgical History:  Procedure Laterality Date  . ENDOMETRIAL ABLATION    . GASTRIC ROUX-EN-Y  04/20/2012   Procedure: LAPAROSCOPIC ROUX-EN-Y GASTRIC BYPASS WITH UPPER ENDOSCOPY;  Surgeon: Atilano Ina, MD,FACS;  Location: [facility];  Service: General;;  . LASIK Bilateral 2015  . MYOMECTOMY    . TUBAL LIGATION  01/27/2002         Family History  Problem Relation Age of Onset  . Heart disease Mother   . Diabetes Mother   . Depression Mother        committed suicide at age 60  . Alcohol abuse Mother   . Hypertension Mother   . Alcohol abuse Father        died from murder  . Cancer Paternal Grandmother        colon  . Diabetes Sister   . CAD Sister        s/p bypass,   . Obesity Sister        weighs  400 lbs  . Heart disease Maternal Grandfather          Allergies  Allergen Reactions  . Epinephrine Other (See Comments) and Palpitations    syncope  . Prednisone Other (See Comments)    Psychological rxn: pt reports "losing time"  . Influenza Vac Subunit Quad     Localized swelling at injection site. States she has tolerated flu mist in the past.          Current Outpatient Medications on File Prior to Visit  Medication Sig Dispense Refill  . amLODipine (NORVASC) 5 MG tablet Take 1 tablet (5 mg total) by mouth daily. 30 tablet 3  . betamethasone dipropionate (DIPROLENE) 0.05 % cream Apply topically 2 (two) times daily. 60 g 0  . clonazePAM (KLONOPIN) 0.5 MG tablet Take 0.5 mg by mouth 2 (two) times daily as needed. anxiety    . clotrimazole-betamethasone (LOTRISONE) cream Apply 1 application topically 2 (two) times daily. 15 g 0  . FLUoxetine (PROZAC) 40 MG capsule Take 1 capsule (40 mg total) by mouth every morning. 90 capsule 1   No current facility-administered medications on file prior to visit.     BP (!) 144/88 (BP Location: Left Arm, Patient  Position: Sitting, Cuff Size: Large)   Pulse 66   Temp 98.2 F (36.8 C) (Oral)   Resp 16   Ht 5\' 3"  (1.6 m)   Wt 200 lb (90.7 kg)   SpO2 99%   BMI 35.43 kg/m    Objective:   Physical Exam  Constitutional: She appears well-developed and well-nourished.  Cardiovascular: Normal rate, regular rhythm and normal heart sounds.  No murmur heard. Pulmonary/Chest: Effort normal and breath sounds normal. No respiratory distress. She has no wheezes.  Psychiatric: She has a normal mood and affect. Her behavior is normal. Judgment and thought content normal.          Assessment & Plan:  HTN- bp is improving but not at goal. Advised pt to continue amlodipine 5mg  and follow up in weeks with RN for bp recheck. If sbp remains >140 will plan to increase amlodipine to 10mg .

## 2018-01-20 ENCOUNTER — Ambulatory Visit: Payer: BLUE CROSS/BLUE SHIELD | Admitting: Family Medicine

## 2018-01-20 ENCOUNTER — Encounter: Payer: Self-pay | Admitting: Family Medicine

## 2018-01-20 DIAGNOSIS — M7711 Lateral epicondylitis, right elbow: Secondary | ICD-10-CM

## 2018-01-20 NOTE — Patient Instructions (Signed)
You have lateral epicondylitis Try to avoid painful activities as much as possible. Ice the area 3-4 times a day for 15 minutes at a time. Ok to use voltaren gel up to 4 times a day. Try a counterforce brace/strap instead of the sleeve. Wrist brace as often as possible. Hammer rotation exercise, wrist extension exercise without weight - 3 sets of 10 once a day.   Stretching - hold for 20-30 seconds and repeat 3 times. Consider physical therapy, injection, nitro patches if not improving. Follow up in 6 weeks but call me sooner if you want to do any of the above.

## 2018-01-21 ENCOUNTER — Encounter: Payer: Self-pay | Admitting: Family Medicine

## 2018-01-21 ENCOUNTER — Ambulatory Visit: Payer: BLUE CROSS/BLUE SHIELD

## 2018-01-21 NOTE — Progress Notes (Signed)
PCP and consultation requested by: Sandra Nichols, Melissa, NP  Subjective:   HPI: Patient is a 42 y.o. female here for right elbow pain.  12/10/17: Patient denies known trauma or injury. She reports about 3 weeks ago she started to get lateral right elbow pain. Started during a work trip to [place] when she had to pick up her bag a lot. Has tried using voltaren topically with mild benefit. Pain is 2/10 but up to 6/10 and sharp at times. Worse with flexion and extension. No skin changes, numbness.  01/20/18: Patient reports she feels about the same. Lateral right elbow aches by end of day. Pain is 2/10 level but gets up to 6-7/10 at times. Sleeve itches so not using. Tried icing, voltaren gel. Doing home exercises without weight - too much pain with a weight or hammer. No skin changes, numbness.  Past Medical History:  Diagnosis Date  . Anemia   . Breast cyst, left 07/25/2017   3 cysts. Resume screening in 1 yr per pt.  . Depression   . GERD (gastroesophageal reflux disease)   . Irritable bowel syndrome   . OCD (obsessive compulsive disorder)   . Panic attack     Current Outpatient Medications on File Prior to Visit  Medication Sig Dispense Refill  . amLODipine (NORVASC) 5 MG tablet Take 1 tablet (5 mg total) by mouth daily. 30 tablet 3  . betamethasone dipropionate (DIPROLENE) 0.05 % cream Apply topically 2 (two) times daily. 60 g 0  . clonazePAM (KLONOPIN) 0.5 MG tablet Take 0.5 mg by mouth 2 (two) times daily as needed. anxiety    . clotrimazole-betamethasone (LOTRISONE) cream Apply 1 application topically 2 (two) times daily. 15 g 0  . FLUoxetine (PROZAC) 40 MG capsule Take 1 capsule (40 mg total) by mouth every morning. 90 capsule 1   No current facility-administered medications on file prior to visit.     Past Surgical History:  Procedure Laterality Date  . ENDOMETRIAL ABLATION    . GASTRIC ROUX-EN-Y  04/20/2012   Procedure: LAPAROSCOPIC ROUX-EN-Y GASTRIC BYPASS  WITH UPPER ENDOSCOPY;  Surgeon: Atilano InaEric M Wilson, MD,FACS;  Location: [facility];  Service: General;;  . LASIK Bilateral 2015  . MYOMECTOMY    . TUBAL LIGATION  01/27/2002    Allergies  Allergen Reactions  . Epinephrine Other (See Comments) and Palpitations    syncope  . Prednisone Other (See Comments)    Psychological rxn: pt reports "losing time"  . Influenza Vac Subunit Quad     Localized swelling at injection site. States she has tolerated flu mist in the past.    Social History   Socioeconomic History  . Marital status: Married    Spouse name: Not on file  . Number of children: Not on file  . Years of education: Not on file  . Highest education level: Not on file  Social Needs  . Financial resource strain: Not on file  . Food insecurity - worry: Not on file  . Food insecurity - inability: Not on file  . Transportation needs - medical: Not on file  . Transportation needs - non-medical: Not on file  Occupational History  . Not on file  Tobacco Use  . Smoking status: Never Smoker  . Smokeless tobacco: Never Used  Substance and Sexual Activity  . Alcohol use: Yes    Alcohol/week: 0.0 oz    Comment: 1 1/2 bottles of wine per weekend.  . Drug use: No  . Sexual activity: Yes  Partners: Male    Birth control/protection: Surgical    Comment: ablation  Other Topics Concern  . Not on file  Social History Narrative   Married   Works in Counselling psychologist work at [employer]   3 biological children   1996- son Ree Kida   2000- [name] son   2003-[name]      2 children "non-official foster children" teens, live with her   Enjoys watching netflix, movies, spending time with her children, going out to eat   Completed a masters in Museum/gallery curator    Family History  Problem Relation Age of Onset  . Heart disease Mother   . Diabetes Mother   . Depression Mother        committed suicide at age 86  . Alcohol abuse Mother   . Hypertension Mother   . Alcohol abuse Father        died from murder   . Cancer Paternal Grandmother        colon  . Diabetes Sister   . CAD Sister        s/p bypass,   . Obesity Sister        weighs 400 lbs  . Heart disease Maternal Grandfather     BP (!) 142/91   Pulse 69   Ht 5\' 3"  (1.6 m)   Wt 200 lb (90.7 kg)   BMI 35.43 kg/m   Review of Systems: See HPI above.     Objective:  Physical Exam:  Gen: NAD, comfortable in exam room.  Right elbow: No deformity, swelling, bruising. TTP lateral epicondyle.  No other tenderness. FROM with 5/5 strength. Pain with resisted wrist extension and supination, none with 3rd finger extension. Collateral ligaments intact. NVI distally.  Assessment & Plan:  1. Right lateral epicondylitis - Continue home exercises.  She will try a counterforce brace instead of the sleeve.  Wrist brace also.  Icing, voltaren gel.  Consider PT, injection, nitro patches if she's struggling.  F/u in 6 weeks.

## 2018-01-21 NOTE — Assessment & Plan Note (Signed)
Continue home exercises.  She will try a counterforce brace instead of the sleeve.  Wrist brace also.  Icing, voltaren gel.  Consider PT, injection, nitro patches if she's struggling.  F/u in 6 weeks.

## 2018-01-23 ENCOUNTER — Ambulatory Visit (INDEPENDENT_AMBULATORY_CARE_PROVIDER_SITE_OTHER): Payer: BLUE CROSS/BLUE SHIELD | Admitting: Family

## 2018-01-23 VITALS — BP 139/92 | HR 76

## 2018-01-23 DIAGNOSIS — I10 Essential (primary) hypertension: Secondary | ICD-10-CM

## 2018-01-23 MED ORDER — AMLODIPINE BESYLATE 10 MG PO TABS: 10.0000 mg | ORAL_TABLET | Freq: Every day | ORAL | 2 refills | Status: DC

## 2018-01-23 NOTE — Progress Notes (Signed)
Reviewed and agree.

## 2018-01-23 NOTE — Patient Instructions (Signed)
Increase amlodipine to 10mg  once a day (prescription has been sent to your pharmacy) and return in 2 weeks (02/06/18 2:30pm) for nurse visit BP check.

## 2018-01-23 NOTE — Progress Notes (Signed)
Pre visit review using our clinic review tool, if applicable. No additional management support is needed unless otherwise documented below in the visit note.   Pt here for BP check. Currently on amlodipine 5mg  once a day. May have missed one dose this past weekend.  Reports no side effects and has no concerns today.   Last BP on 01/06/18 = 144/88  BP today = 139/92,  HR= 76 O2= 97% Repeat BP = 130/92  Advised pt per verbal from PCP, increase amlodipine to 10mg  once a day and return in 2 weeks for nurse visit BP check. (02/06/18 @2 :30pm)

## 2018-02-06 ENCOUNTER — Ambulatory Visit: Payer: BLUE CROSS/BLUE SHIELD

## 2018-02-06 VITALS — BP 138/89 | HR 73

## 2018-02-06 DIAGNOSIS — I10 Essential (primary) hypertension: Secondary | ICD-10-CM

## 2018-02-06 NOTE — Progress Notes (Signed)
Pt came in today for BP check. Pt's BP was 138/89 and her heart rate was 73. After consulting with PCP Peggyann Juba'Sullivan Pt is to  Continue current regimen and follow up in 3 months. Pt stated complinace. I asked Pt if she needed refills and she said not at this time.

## 2018-02-19 ENCOUNTER — Other Ambulatory Visit: Payer: Self-pay

## 2018-02-19 MED ORDER — AMLODIPINE BESYLATE 10 MG PO TABS: 10.0000 mg | ORAL_TABLET | Freq: Every day | ORAL | 1 refills | Status: DC

## 2018-02-19 NOTE — Telephone Encounter (Signed)
Received fax requesting 90 day supply of patients amlodipine. I have sent prescription in to pharmacy for patient.

## 2018-05-13 ENCOUNTER — Ambulatory Visit: Payer: BLUE CROSS/BLUE SHIELD

## 2018-05-21 ENCOUNTER — Ambulatory Visit (INDEPENDENT_AMBULATORY_CARE_PROVIDER_SITE_OTHER): Payer: BLUE CROSS/BLUE SHIELD | Admitting: Family Medicine

## 2018-05-21 VITALS — BP 122/86 | HR 77

## 2018-05-21 DIAGNOSIS — I10 Essential (primary) hypertension: Secondary | ICD-10-CM

## 2018-05-21 NOTE — Progress Notes (Signed)
Pre visit review using our clinic tool,if applicable. No additional management support is needed unless otherwise documented below in the visit note.   Pt here for Blood pressure check per Sandford Craze, NP  Pt currently takes: Amlodipine 10 mg daily.   Pt reports compliance with medication.  BP today @ = 122/86 HR = 77  Pt advised per Dr. Danise Edge to continue medications as ordered and keep next follow up appointment with provider.

## 2018-06-01 ENCOUNTER — Telehealth: Payer: Self-pay | Admitting: Family

## 2018-06-02 NOTE — Telephone Encounter (Signed)
8/19 please.

## 2018-06-02 NOTE — Telephone Encounter (Signed)
Sandra Nichols-- last OV with you was 01/2018. Pt had nurse visit on 05/20/18 for BP check. Please advise when pt should follow up with you in the office?

## 2018-06-11 NOTE — Telephone Encounter (Signed)
Called pt and LVM advising pt to call back and schedule an appt for 07/2018

## 2018-07-27 ENCOUNTER — Ambulatory Visit: Payer: BLUE CROSS/BLUE SHIELD | Admitting: Family

## 2018-08-07 ENCOUNTER — Encounter: Payer: Self-pay | Admitting: Family

## 2018-08-07 ENCOUNTER — Ambulatory Visit: Payer: BLUE CROSS/BLUE SHIELD | Admitting: Family

## 2018-08-07 VITALS — BP 130/82 | HR 86 | Temp 98.3°F | Resp 16 | Ht 63.0 in | Wt 207.8 lb

## 2018-08-07 DIAGNOSIS — F429 Obsessive-compulsive disorder, unspecified: Secondary | ICD-10-CM | POA: Diagnosis not present

## 2018-08-07 DIAGNOSIS — I10 Essential (primary) hypertension: Secondary | ICD-10-CM | POA: Diagnosis not present

## 2018-08-07 DIAGNOSIS — F419 Anxiety disorder, unspecified: Secondary | ICD-10-CM

## 2018-08-07 DIAGNOSIS — F329 Major depressive disorder, single episode, unspecified: Secondary | ICD-10-CM

## 2018-08-07 DIAGNOSIS — F32A Depression, unspecified: Secondary | ICD-10-CM

## 2018-08-07 LAB — BASIC METABOLIC PANEL
BUN: 16 mg/dL (ref 6–23)
CALCIUM: 9.5 mg/dL (ref 8.4–10.5)
CO2: 27 mEq/L (ref 19–32)
Chloride: 107 mEq/L (ref 96–112)
Creatinine, Ser: 0.71 mg/dL (ref 0.40–1.20)
GFR: 94.83 mL/min (ref >60.00)
GLUCOSE: 65 mg/dL — AB (ref 70–99)
POTASSIUM: 3.7 meq/L (ref 3.5–5.1)
Sodium: 142 mEq/L (ref 135–145)

## 2018-08-07 NOTE — Progress Notes (Signed)
Subjective:    Patient ID: Sandra Nichols, female    DOB: 1976-08-21, 42 y.o.   MRN: [id number]  HPI  Sandra Nichols is a 42 yr old female who presents today for follow up.  HTN- patient is maintained on amlodipine 40mg . Mild LE edema, chronic.  BP Readings from Last 3 Encounters:  08/07/18 130/82  05/21/18 122/86  02/06/18 138/89   Stress- reports that she has been stress eating.  Reports that she has been overeating.    Wt Readings from Last 3 Encounters:  08/07/18 207 lb 12.8 oz (94.3 kg)  01/20/18 200 lb (90.7 kg)  01/06/18 200 lb (90.7 kg)   OCD/Depression/Anxiety- maintained on prozac 40mg . Reports OCD symptoms are stable.    Review of Systems    see HPI  Past Medical History:  Diagnosis Date  . Anemia   . Breast cyst, left 07/25/2017   3 cysts. Resume screening in 1 yr per pt.  . Depression   . GERD (gastroesophageal reflux disease)   . Irritable bowel syndrome   . OCD (obsessive compulsive disorder)   . Panic attack      Social History   Socioeconomic History  . Marital status: Married    Spouse name: Not on file  . Number of children: Not on file  . Years of education: Not on file  . Highest education level: Not on file  Occupational History  . Not on file  Social Needs  . Financial resource strain: Not on file  . Food insecurity:    Worry: Not on file    Inability: Not on file  . Transportation needs:    Medical: Not on file    Non-medical: Not on file  Tobacco Use  . Smoking status: Never Smoker  . Smokeless tobacco: Never Used  Substance and Sexual Activity  . Alcohol use: Yes    Comment: 1 1/2 bottles of wine per weekend.  . Drug use: No  . Sexual activity: Yes    Partners: Male    Birth control/protection: Surgical    Comment: ablation  Lifestyle  . Physical activity:    Days per week: Not on file    Minutes per session: Not on file  . Stress: Not on file  Relationships  . Social connections:    Talks on phone: Not on file    Gets  together: Not on file    Attends religious service: Not on file    Active member of club or organization: Not on file    Attends meetings of clubs or organizations: Not on file    Relationship status: Not on file  . Intimate partner violence:    Fear of current or ex partner: Not on file    Emotionally abused: Not on file    Physically abused: Not on file    Forced sexual activity: Not on file  Other Topics Concern  . Not on file  Social History Narrative   Married   Works in Counselling psychologistT desk work at [employer]   3 biological children   1996- son Sandra KidaJack   2000- Sandra Nichols son   2003-Sandra Nichols      2 children "non-official foster children" teens, live with her   Enjoys watching netflix, movies, spending time with her children, going out to eat   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    Past Surgical History:  Procedure Laterality Date  . ENDOMETRIAL ABLATION    . GASTRIC ROUX-EN-Y  04/20/2012   Procedure: LAPAROSCOPIC ROUX-EN-Y  GASTRIC BYPASS WITH UPPER ENDOSCOPY;  Surgeon: Atilano InaEric M Wilson, MD,FACS;  Location: [facility];  Service: General;;  . LASIK Bilateral 2015  . MYOMECTOMY    . TUBAL LIGATION  01/27/2002    Family History  Problem Relation Age of Onset  . Heart disease Mother   . Diabetes Mother   . Depression Mother        committed suicide at age 44  . Alcohol abuse Mother   . Hypertension Mother   . Alcohol abuse Father        died from murder  . Cancer Paternal Grandmother        colon  . Diabetes Sister   . CAD Sister        s/p bypass,   . Obesity Sister        weighs 400 lbs  . Heart disease Maternal Grandfather     Allergies  Allergen Reactions  . Epinephrine Other (See Comments) and Palpitations    syncope  . Prednisone Other (See Comments)    Psychological rxn: pt reports "losing time"  . Influenza Vac Subunit Quad     Localized swelling at injection site. States she has tolerated flu mist in the past.    Current Outpatient Medications on File Prior to Visit    Medication Sig Dispense Refill  . amLODipine (NORVASC) 10 MG tablet Take 1 tablet (10 mg total) by mouth daily. 90 tablet 1  . betamethasone dipropionate (DIPROLENE) 0.05 % cream Apply topically 2 (two) times daily. 60 g 0  . clonazePAM (KLONOPIN) 0.5 MG tablet Take 0.5 mg by mouth 2 (two) times daily as needed. anxiety    . clotrimazole-betamethasone (LOTRISONE) cream Apply 1 application topically 2 (two) times daily. 15 g 0  . FLUoxetine (PROZAC) 40 MG capsule TAKE 1 CAPSULE EVERY MORNING 90 capsule 0   No current facility-administered medications on file prior to visit.     BP 130/82 (BP Location: Right Arm, Cuff Size: Large)   Pulse 86   Temp 98.3 F (36.8 C) (Oral)   Resp 16   Ht 5\' 3"  (1.6 m)   Wt 207 lb 12.8 oz (94.3 kg)   SpO2 99%   BMI 36.81 kg/m    Objective:   Physical Exam  Constitutional: She is oriented to person, place, and time. She appears well-developed and well-nourished.  Cardiovascular: Normal rate, regular rhythm and normal heart sounds.  No murmur heard. Pulmonary/Chest: Effort normal and breath sounds normal. No respiratory distress. She has no wheezes.  Neurological: She is alert and oriented to person, place, and time.  Skin: Skin is warm and dry.  Psychiatric: She has a normal mood and affect. Her behavior is normal. Judgment and thought content normal.          Assessment & Plan:  HTN- bp stable on current dose of amlodipine, continue same. Obtain follow up bmet.  Anxiety/depression/ocd- overall stable. We discussed stress eating and working on alternative outlets for stress. Continue current dose of prozac.

## 2018-08-07 NOTE — Patient Instructions (Signed)
Please complete lab work prior to leaving.   

## 2018-08-11 LAB — PAIN MGMT, PROFILE 8 W/CONF, U
6 Acetylmorphine: NEGATIVE ng/mL (ref <10)
ALCOHOL METABOLITES: POSITIVE ng/mL — AB (ref <500)
AMPHETAMINES: NEGATIVE ng/mL (ref <500)
BENZODIAZEPINES: NEGATIVE ng/mL (ref <100)
BUPRENORPHINE, URINE: NEGATIVE ng/mL (ref <5)
COCAINE METABOLITE: NEGATIVE ng/mL (ref <150)
CREATININE: 170.2 mg/dL
Ethyl Glucuronide (ETG): >300000 ng/mL — ABNORMAL HIGH (ref <500)
MDMA: NEGATIVE ng/mL (ref <500)
Marijuana Metabolite: NEGATIVE ng/mL (ref <20)
OXIDANT: NEGATIVE ug/mL (ref <200)
Opiates: NEGATIVE ng/mL (ref <100)
Oxycodone: NEGATIVE ng/mL (ref <100)
PH: 5.34 (ref 4.5–9.0)

## 2018-08-13 ENCOUNTER — Encounter: Payer: Self-pay | Admitting: Family

## 2018-08-17 ENCOUNTER — Ambulatory Visit: Payer: BLUE CROSS/BLUE SHIELD | Admitting: Family Medicine

## 2018-08-17 ENCOUNTER — Encounter: Payer: Self-pay | Admitting: Family Medicine

## 2018-08-17 VITALS — BP 153/97 | HR 89 | Ht 63.0 in | Wt 206.0 lb

## 2018-08-17 DIAGNOSIS — R0781 Pleurodynia: Secondary | ICD-10-CM

## 2018-08-17 NOTE — Progress Notes (Signed)
PCP: Sandford Craze'Sullivan, Melissa, NP  Subjective:   HPI: Patient is a 42 y.o. female here for left posterior rib pain.  Patient states about a week ago she fell going on the steps when her dogs knocked her legs out from underneath her.  She fell on her left side and left shoulder.  She reports posterior lateral rib pain with palpation and feels pain with full inspiration.  She denies any fevers or chills.  She denies any wheezing.  She denies any shortness of breath or chest pain.  No history of rib fracture in the past.  Past Medical History:  Diagnosis Date  . Anemia   . Breast cyst, left 07/25/2017   3 cysts. Resume screening in 1 yr per pt.  . Depression   . GERD (gastroesophageal reflux disease)   . Irritable bowel syndrome   . OCD (obsessive compulsive disorder)   . Panic attack     Current Outpatient Medications on File Prior to Visit  Medication Sig Dispense Refill  . amLODipine (NORVASC) 10 MG tablet Take 1 tablet (10 mg total) by mouth daily. 90 tablet 1  . betamethasone dipropionate (DIPROLENE) 0.05 % cream Apply topically 2 (two) times daily. 60 g 0  . clonazePAM (KLONOPIN) 0.5 MG tablet Take 0.5 mg by mouth 2 (two) times daily as needed. anxiety    . clotrimazole-betamethasone (LOTRISONE) cream Apply 1 application topically 2 (two) times daily. 15 g 0  . FLUoxetine (PROZAC) 40 MG capsule TAKE 1 CAPSULE EVERY MORNING 90 capsule 0   No current facility-administered medications on file prior to visit.     Past Surgical History:  Procedure Laterality Date  . ENDOMETRIAL ABLATION    . GASTRIC ROUX-EN-Y  04/20/2012   Procedure: LAPAROSCOPIC ROUX-EN-Y GASTRIC BYPASS WITH UPPER ENDOSCOPY;  Surgeon: Atilano InaEric M Wilson, MD,FACS;  Location: [facility];  Service: General;;  . LASIK Bilateral 2015  . MYOMECTOMY    . TUBAL LIGATION  01/27/2002    Allergies  Allergen Reactions  . Epinephrine Other (See Comments) and Palpitations    syncope  . Prednisone Other (See Comments)    Psychological  rxn: pt reports "losing time"  . Influenza Vac Subunit Quad     Localized swelling at injection site. States she has tolerated flu mist in the past.    Social History   Socioeconomic History  . Marital status: Married    Spouse name: Not on file  . Number of children: Not on file  . Years of education: Not on file  . Highest education level: Not on file  Occupational History  . Not on file  Social Needs  . Financial resource strain: Not on file  . Food insecurity:    Worry: Not on file    Inability: Not on file  . Transportation needs:    Medical: Not on file    Non-medical: Not on file  Tobacco Use  . Smoking status: Never Smoker  . Smokeless tobacco: Never Used  Substance and Sexual Activity  . Alcohol use: Yes    Comment: 1 1/2 bottles of wine per weekend.  . Drug use: No  . Sexual activity: Yes    Partners: Male    Birth control/protection: Surgical    Comment: ablation  Lifestyle  . Physical activity:    Days per week: Not on file    Minutes per session: Not on file  . Stress: Not on file  Relationships  . Social connections:    Talks on phone: Not on  file    Gets together: Not on file    Attends religious service: Not on file    Active member of club or organization: Not on file    Attends meetings of clubs or organizations: Not on file    Relationship status: Not on file  . Intimate partner violence:    Fear of current or ex partner: Not on file    Emotionally abused: Not on file    Physically abused: Not on file    Forced sexual activity: Not on file  Other Topics Concern  . Not on file  Social History Narrative   Married   Works in Counselling psychologist work at [employer]   3 biological children   1996- son Ree Kida   2000- [name] son   2003-[name]      2 children "non-official foster children" teens, live with her   Enjoys watching netflix, movies, spending time with her children, going out to eat   Completed a masters in Museum/gallery curator    Family History   Problem Relation Age of Onset  . Heart disease Mother   . Diabetes Mother   . Depression Mother        committed suicide at age 30  . Alcohol abuse Mother   . Hypertension Mother   . Alcohol abuse Father        died from murder  . Cancer Paternal Grandmother        colon  . Diabetes Sister   . CAD Sister        s/p bypass,   . Obesity Sister        weighs 400 lbs  . Heart disease Maternal Grandfather     BP (!) 153/97   Pulse 89   Ht 5\' 3"  (1.6 m)   Wt 206 lb (93.4 kg)   BMI 36.49 kg/m   Review of Systems: See HPI above.     Objective:  Physical Exam:  Gen: awake, alert, NAD, comfortable in exam room Pulm: breathing unlabored.  Equal bilaterally, clear to auscultation without wheezes, rales, rhonchi CV: RRR, no rubs, murmurs, gallops  MSK: Ribs Inspection: There is no bruising or erythema surrounding the back or lateral ribs Palpation: There is localized tenderness with palpation over rib angles of 9,10 and 11 posteriorly.  No crepitus.  There is no reproduced pain with compression of the rib cage from a lateral approach ROM: Full range of motion of the lumbar and thoracic spine [place]: Limited US evaluation of the left ribs performed. No obvious cortical irregularities or localized swelling seen.  Lumbar Spine: No spinous process TTP. Full ROM. No LE neurologic deficits   Assessment & Plan:  1.  Posterior lateral left-sided rib pain likely secondary to soft tissue or possibly bony contusion following a fall.  Low suspicion for rib fracture at this time.  No evidence of pneumothorax or pneumonia. Offered the pt option for xrays which she declined. Pt may do activities as tolerated. Ibuprofen 800mg  TID or aleve 2 tablets BID for pain. F/u as needed.

## 2018-08-17 NOTE — Patient Instructions (Signed)
You have a rib contusion. Icing 15 minutes at a time 3-4 times a day. Tylenol 500mg  1-2 tabs three times a day as needed for pain. Aleve 2 tabs twice a day with food as needed for pain and inflammation. Typically these take 2-3 weeks to resolve. Unfortunately there isn't much else you can do to accelerate this process. We recommend against any rib binders or wrapping of the area as we discussed. Follow up with me as needed.

## 2018-08-18 ENCOUNTER — Encounter: Payer: Self-pay | Admitting: Family Medicine

## 2018-09-15 ENCOUNTER — Other Ambulatory Visit: Payer: Self-pay | Admitting: Family

## 2018-09-15 DIAGNOSIS — Z1231 Encounter for screening mammogram for malignant neoplasm of breast: Secondary | ICD-10-CM

## 2018-09-24 ENCOUNTER — Other Ambulatory Visit: Payer: Self-pay | Admitting: Family

## 2018-09-24 ENCOUNTER — Ambulatory Visit
Admission: RE | Admit: 2018-09-24 | Discharge: 2018-09-24 | Disposition: A | Discharge status: Home / Self Care | Payer: BLUE CROSS/BLUE SHIELD | Source: Ambulatory Visit

## 2018-09-24 DIAGNOSIS — Z1231 Encounter for screening mammogram for malignant neoplasm of breast: Secondary | ICD-10-CM | POA: Diagnosis not present

## 2018-09-25 ENCOUNTER — Other Ambulatory Visit: Payer: Self-pay | Admitting: Family

## 2018-09-25 DIAGNOSIS — R928 Other abnormal and inconclusive findings on diagnostic imaging of breast: Secondary | ICD-10-CM

## 2018-09-25 NOTE — Progress Notes (Signed)
Patient reports she viewed results on MyChart and made appointment for additional images to be done next week

## 2018-10-01 ENCOUNTER — Ambulatory Visit
Admission: RE | Admit: 2018-10-01 | Discharge: 2018-10-01 | Disposition: A | Discharge status: Home / Self Care | Payer: BLUE CROSS/BLUE SHIELD | Source: Ambulatory Visit | Attending: Family | Admitting: Family

## 2018-10-01 DIAGNOSIS — N6012 Diffuse cystic mastopathy of left breast: Secondary | ICD-10-CM | POA: Diagnosis not present

## 2018-10-01 DIAGNOSIS — R922 Inconclusive mammogram: Secondary | ICD-10-CM | POA: Diagnosis not present

## 2018-10-01 DIAGNOSIS — R928 Other abnormal and inconclusive findings on diagnostic imaging of breast: Secondary | ICD-10-CM

## 2018-11-04 DIAGNOSIS — D224 Melanocytic nevi of scalp and neck: Secondary | ICD-10-CM | POA: Diagnosis not present

## 2018-11-04 DIAGNOSIS — D225 Melanocytic nevi of trunk: Secondary | ICD-10-CM | POA: Diagnosis not present

## 2018-11-04 DIAGNOSIS — D2262 Melanocytic nevi of left upper limb, including shoulder: Secondary | ICD-10-CM | POA: Diagnosis not present

## 2018-11-04 DIAGNOSIS — D2261 Melanocytic nevi of right upper limb, including shoulder: Secondary | ICD-10-CM | POA: Diagnosis not present

## 2018-11-05 ENCOUNTER — Encounter (HOSPITAL_COMMUNITY): Payer: Self-pay

## 2018-11-16 ENCOUNTER — Ambulatory Visit: Payer: BLUE CROSS/BLUE SHIELD | Admitting: Family

## 2018-12-07 ENCOUNTER — Encounter: Payer: Self-pay | Admitting: Family

## 2018-12-07 ENCOUNTER — Ambulatory Visit: Payer: BLUE CROSS/BLUE SHIELD | Admitting: Family

## 2018-12-07 VITALS — BP 153/90 | HR 75 | Temp 98.7°F | Resp 16 | Ht 63.0 in | Wt 211.0 lb

## 2018-12-07 DIAGNOSIS — R21 Rash and other nonspecific skin eruption: Secondary | ICD-10-CM

## 2018-12-07 DIAGNOSIS — I10 Essential (primary) hypertension: Secondary | ICD-10-CM

## 2018-12-07 DIAGNOSIS — F418 Other specified anxiety disorders: Secondary | ICD-10-CM

## 2018-12-07 MED ORDER — METOPROLOL SUCCINATE ER 25 MG PO TB24: 25.0000 mg | ORAL_TABLET | Freq: Every day | ORAL | 3 refills | Status: DC

## 2018-12-07 NOTE — Patient Instructions (Addendum)
Please add Toprol xl once daily for blood pressure.  Let me know if rash worsens or if not improved in 1 week.  You can try applying salon pas patches to affected area as needed for pain. Let me know if your depression symptoms worsen.

## 2018-12-07 NOTE — Progress Notes (Signed)
Subjective:    Patient ID: Sandra Nichols, female    DOB: [birth year], 42 y.o.   MRN: [id number]  HPI  Patient is a 42 yr old female who presents today for follow up.  HTN- Patient is maintained on amlodipine 10mg  once daily. Reports that she has been stressed recently.  [place] Sunday night.  BP Readings from Last 3 Encounters:  12/07/18 (!) 153/90  08/17/18 (!) 153/97  08/07/18 130/82   Depression/anxiety/OCD- patient is maintained on prozac 40mg . Notes anxiety is high due to reorganization. Feels like this is contributing to her anxiety.   Rash- left upper thigh- started last week.  Painful.   Review of Systems    see HPI  Past Medical History:  Diagnosis Date  . Anemia   . Breast cyst, left 07/25/2017   3 cysts. Resume screening in 1 yr per pt.  . Depression   . GERD (gastroesophageal reflux disease)   . Irritable bowel syndrome   . OCD (obsessive compulsive disorder)   . Panic attack      Social History   Socioeconomic History  . Marital status: Married    Spouse name: Not on file  . Number of children: Not on file  . Years of education: Not on file  . Highest education level: Not on file  Occupational History  . Not on file  Social Needs  . Financial resource strain: Not on file  . Food insecurity:    Worry: Not on file    Inability: Not on file  . Transportation needs:    Medical: Not on file    Non-medical: Not on file  Tobacco Use  . Smoking status: Never Smoker  . Smokeless tobacco: Never Used  Substance and Sexual Activity  . Alcohol use: Yes    Comment: 1 1/2 bottles of wine per weekend.  . Drug use: No  . Sexual activity: Yes    Partners: Male    Birth control/protection: Surgical    Comment: ablation  Lifestyle  . Physical activity:    Days per week: Not on file    Minutes per session: Not on file  . Stress: Not on file  Relationships  . Social connections:    Talks on phone: Not on file    Gets together: Not on file   Attends religious service: Not on file    Active member of club or organization: Not on file    Attends meetings of clubs or organizations: Not on file    Relationship status: Not on file  . Intimate partner violence:    Fear of current or ex partner: Not on file    Emotionally abused: Not on file    Physically abused: Not on file    Forced sexual activity: Not on file  Other Topics Concern  . 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 ENDOMETRIAL ABLATION    . GASTRIC ROUX-EN-Y  04/20/2012   Procedure: LAPAROSCOPIC ROUX-EN-Y GASTRIC BYPASS WITH UPPER ENDOSCOPY;  Surgeon: Atilano InaEric M Wilson, MD,FACS;  Location:  [facility];  Service: General;;  . LASIK Bilateral 2015  . MYOMECTOMY    . TUBAL LIGATION  01/27/2002    Family History  Problem Relation Age of Onset  . Heart disease Mother   . Diabetes Mother   . Depression Mother        committed suicide at age 68  . Alcohol abuse Mother   . Hypertension Mother   . Alcohol abuse Father        died from murder  . Cancer Paternal Grandmother        colon  . Diabetes Sister   . CAD Sister        s/p bypass,   . Obesity Sister        weighs 400 lbs  . Heart disease Maternal Grandfather     Allergies  Allergen Reactions  . Epinephrine Other (See Comments) and Palpitations    syncope  . Prednisone Other (See Comments)    Psychological rxn: pt reports "losing time"  . Influenza Vac Subunit Quad     Localized swelling at injection site. States she has tolerated flu mist in the past.    Current Outpatient Medications on File Prior to Visit  Medication Sig Dispense Refill  .  amLODipine (NORVASC) 10 MG tablet TAKE 1 TABLET DAILY (THIS WILL REPLACE 5 MG DOSE) 90 tablet 0  . betamethasone dipropionate (DIPROLENE) 0.05 % cream Apply topically 2 (two) times daily. 60 g 0  . clonazePAM (KLONOPIN) 0.5 MG tablet Take 0.5 mg by mouth 2 (two) times daily as needed. anxiety    . clotrimazole-betamethasone (LOTRISONE) cream Apply 1 application topically 2 (two) times daily. 15 g 0  . FLUoxetine (PROZAC) 40 MG capsule TAKE 1 CAPSULE EVERY MORNING 90 capsule 0   No current facility-administered medications on file prior to visit.     BP (!) 153/90 (BP Location: Right Arm, Patient Position: Sitting, Cuff Size: Large)   Pulse 75   Temp 98.7 F (37.1 C) (Oral)   Resp 16   Ht 5\' 3"  (1.6 m)   Wt 211 lb (95.7 kg)   SpO2 100%   BMI 37.38 kg/m    Objective:   Physical Exam Constitutional:      Appearance: She is well-developed.  Neck:     Musculoskeletal: Neck supple.     Thyroid: No thyromegaly.  Cardiovascular:     Rate and Rhythm: Normal rate and regular rhythm.     Heart sounds: Normal heart sounds. No murmur.  Pulmonary:     Effort: Pulmonary effort is normal. No respiratory distress.     Breath sounds: Normal breath sounds. No wheezing.  Skin:    General: Skin is warm and dry.     Comments: blochy erythematous rash noted across left upper anterior thigh, no blisters.   Neurological:     Mental Status: She is alert and oriented to person, place, and time.  Psychiatric:        Speech: Speech normal.        Behavior: Behavior normal.        Thought Content: Thought content normal.        Judgment: Judgment normal.     Comments: Slightly flat affect           Assessment & Plan:  Skin rash- suspect shingles. Advised pt that we are outside the window for antiviral medication. Advised pt to let me know if rash worsens or if it does not improve. Can try topical salon pas  patches.  Depression/anxiety- uncontrolled- mainly due to work stress. Will continue  prozac and monitor. If symptoms worsen or fail to improve in the coming months could consider medication change.  HTN- uncontrolled. Add toprol xl 25mg  once daily. Continue amlodipine.

## 2018-12-14 ENCOUNTER — Ambulatory Visit: Payer: BLUE CROSS/BLUE SHIELD | Admitting: Family

## 2019-01-07 ENCOUNTER — Ambulatory Visit: Payer: Self-pay | Admitting: Podiatry

## 2019-01-07 ENCOUNTER — Ambulatory Visit: Payer: BLUE CROSS/BLUE SHIELD

## 2019-01-12 ENCOUNTER — Ambulatory Visit (INDEPENDENT_AMBULATORY_CARE_PROVIDER_SITE_OTHER): Payer: BLUE CROSS/BLUE SHIELD

## 2019-01-12 DIAGNOSIS — I10 Essential (primary) hypertension: Secondary | ICD-10-CM

## 2019-01-12 DIAGNOSIS — Z0130 Encounter for examination of blood pressure without abnormal findings: Secondary | ICD-10-CM

## 2019-01-12 MED ORDER — METOPROLOL SUCCINATE ER 50 MG PO TB24: 50.0000 mg | ORAL_TABLET | Freq: Every day | ORAL | 3 refills | Status: DC

## 2019-01-12 NOTE — Progress Notes (Signed)
Took medication yesterday after lunch  No missed doses.  Took cold medication last week, noticed bp was elevated did not take bp.   Today no concerns  Today BP: 141/97  Pulse:74  Per Melissa she wants to increase her Metoprolol to 50mg  daily and come back in one month with Melissa. Patient voiced her understanding and made bp follow up w/pcp 02/12/2019 @ 1pm. Patient was advised to show up 15 minutes prior to visit to be checked in.  Medication was updated and I sent in a new rx to reflect recommendations. Patient has no other concerns    BP Readings from Last 3 Encounters:  12/07/18 (!) 153/90  08/17/18 (!) 153/97  08/07/18 130/82

## 2019-01-12 NOTE — Progress Notes (Signed)
Reviewed and agree.  Tia Hieronymus S O'Sullivan NP 

## 2019-01-13 ENCOUNTER — Other Ambulatory Visit: Payer: Self-pay | Admitting: Podiatry

## 2019-01-13 ENCOUNTER — Ambulatory Visit (INDEPENDENT_AMBULATORY_CARE_PROVIDER_SITE_OTHER): Payer: BLUE CROSS/BLUE SHIELD

## 2019-01-13 ENCOUNTER — Encounter: Payer: Self-pay | Admitting: Podiatry

## 2019-01-13 ENCOUNTER — Ambulatory Visit (INDEPENDENT_AMBULATORY_CARE_PROVIDER_SITE_OTHER): Payer: BLUE CROSS/BLUE SHIELD | Admitting: Podiatry

## 2019-01-13 DIAGNOSIS — M722 Plantar fascial fibromatosis: Secondary | ICD-10-CM

## 2019-01-13 DIAGNOSIS — M7661 Achilles tendinitis, right leg: Secondary | ICD-10-CM

## 2019-01-13 DIAGNOSIS — M7662 Achilles tendinitis, left leg: Secondary | ICD-10-CM

## 2019-01-13 MED ORDER — DICLOFENAC SODIUM 75 MG PO TBEC: 75.0000 mg | DELAYED_RELEASE_TABLET | Freq: Two times a day (BID) | ORAL | 2 refills | Status: DC

## 2019-01-13 MED ORDER — TRIAMCINOLONE ACETONIDE 10 MG/ML IJ SUSP
10.0000 mg | Freq: Once | INTRAMUSCULAR | Status: AC
  Administered 2019-01-13: 10 mg

## 2019-01-13 NOTE — Patient Instructions (Addendum)
Plantar Fasciitis (Heel Spur Syndrome) with Rehab The plantar fascia is a fibrous, ligament-like, soft-tissue structure that spans the bottom of the foot. Plantar fasciitis is a condition that causes pain in the foot due to inflammation of the tissue. SYMPTOMS   Pain and tenderness on the underneath side of the foot.  Pain that worsens with standing or walking. CAUSES  Plantar fasciitis is caused by irritation and injury to the plantar fascia on the underneath side of the foot. Common mechanisms of injury include:  Direct trauma to bottom of the foot.  Damage to a small nerve that runs under the foot where the main fascia attaches to the heel bone.  Stress placed on the plantar fascia due to bone spurs. RISK INCREASES WITH:   Activities that place stress on the plantar fascia (running, jumping, pivoting, or cutting).  Poor strength and flexibility.  Improperly fitted shoes.  Tight calf muscles.  Flat feet.  Failure to warm-up properly before activity.  Obesity. PREVENTION  Warm up and stretch properly before activity.  Allow for adequate recovery between workouts.  Maintain physical fitness:  Strength, flexibility, and endurance.  Cardiovascular fitness.  Maintain a health body weight.  Avoid stress on the plantar fascia.  Wear properly fitted shoes, including arch supports for individuals who have flat feet.  PROGNOSIS  If treated properly, then the symptoms of plantar fasciitis usually resolve without surgery. However, occasionally surgery is necessary.  RELATED COMPLICATIONS   Recurrent symptoms that may result in a chronic condition.  Problems of the lower back that are caused by compensating for the injury, such as limping.  Pain or weakness of the foot during push-off following surgery.  Chronic inflammation, scarring, and partial or complete fascia tear, occurring more often from repeated injections.  TREATMENT  Treatment initially involves the  use of ice and medication to help reduce pain and inflammation. The use of strengthening and stretching exercises may help reduce pain with activity, especially stretches of the Achilles tendon. These exercises may be performed at home or with a therapist. Your caregiver may recommend that you use heel cups of arch supports to help reduce stress on the plantar fascia. Occasionally, corticosteroid injections are given to reduce inflammation. If symptoms persist for greater than 6 months despite non-surgical (conservative), then surgery may be recommended.   MEDICATION   If pain medication is necessary, then nonsteroidal anti-inflammatory medications, such as aspirin and ibuprofen, or other minor pain relievers, such as acetaminophen, are often recommended.  Do not take pain medication within 7 days before surgery.  Prescription pain relievers may be given if deemed necessary by your caregiver. Use only as directed and only as much as you need.  Corticosteroid injections may be given by your caregiver. These injections should be reserved for the most serious cases, because they may only be given a certain number of times.  HEAT AND COLD  Cold treatment (icing) relieves pain and reduces inflammation. Cold treatment should be applied for 10 to 15 minutes every 2 to 3 hours for inflammation and pain and immediately after any activity that aggravates your symptoms. Use ice packs or massage the area with a piece of ice (ice massage).  Heat treatment may be used prior to performing the stretching and strengthening activities prescribed by your caregiver, physical therapist, or athletic trainer. Use a heat pack or soak the injury in warm water.  SEEK IMMEDIATE MEDICAL CARE IF:  Treatment seems to offer no benefit, or the condition worsens.  Any medications   produce adverse side effects.  EXERCISES- RANGE OF MOTION (ROM) AND STRETCHING EXERCISES - Plantar Fasciitis (Heel Spur Syndrome) These exercises  may help you when beginning to rehabilitate your injury. Your symptoms may resolve with or without further involvement from your physician, physical therapist or athletic trainer. While completing these exercises, remember:   Restoring tissue flexibility helps normal motion to return to the joints. This allows healthier, less painful movement and activity.  An effective stretch should be held for at least 30 seconds.  A stretch should never be painful. You should only feel a gentle lengthening or release in the stretched tissue.  RANGE OF MOTION - Toe Extension, Flexion  Sit with your right / left leg crossed over your opposite knee.  Grasp your toes and gently pull them back toward the top of your foot. You should feel a stretch on the bottom of your toes and/or foot.  Hold this stretch for 10 seconds.  Now, gently pull your toes toward the bottom of your foot. You should feel a stretch on the top of your toes and or foot.  Hold this stretch for 10 seconds. Repeat  times. Complete this stretch 3 times per day.   RANGE OF MOTION - Ankle Dorsiflexion, Active Assisted  Remove shoes and sit on a chair that is preferably not on a carpeted surface.  Place right / left foot under knee. Extend your opposite leg for support.  Keeping your heel down, slide your right / left foot back toward the chair until you feel a stretch at your ankle or calf. If you do not feel a stretch, slide your bottom forward to the edge of the chair, while still keeping your heel down.  Hold this stretch for 10 seconds. Repeat 3 times. Complete this stretch 2 times per day.   STRETCH  Gastroc, Standing  Place hands on wall.  Extend right / left leg, keeping the front knee somewhat bent.  Slightly point your toes inward on your back foot.  Keeping your right / left heel on the floor and your knee straight, shift your weight toward the wall, not allowing your back to arch.  You should feel a gentle stretch  in the right / left calf. Hold this position for 10 seconds. Repeat 3 times. Complete this stretch 2 times per day.  STRETCH  Soleus, Standing  Place hands on wall.  Extend right / left leg, keeping the other knee somewhat bent.  Slightly point your toes inward on your back foot.  Keep your right / left heel on the floor, bend your back knee, and slightly shift your weight over the back leg so that you feel a gentle stretch deep in your back calf.  Hold this position for 10 seconds. Repeat 3 times. Complete this stretch 2 times per day.  STRETCH  Gastrocsoleus, Standing  Note: This exercise can place a lot of stress on your foot and ankle. Please complete this exercise only if specifically instructed by your caregiver.   Place the ball of your right / left foot on a step, keeping your other foot firmly on the same step.  Hold on to the wall or a rail for balance.  Slowly lift your other foot, allowing your body weight to press your heel down over the edge of the step.  You should feel a stretch in your right / left calf.  Hold this position for 10 seconds.  Repeat this exercise with a slight bend in your right /   left knee. Repeat 3 times. Complete this stretch 2 times per day.   STRENGTHENING EXERCISES - Plantar Fasciitis (Heel Spur Syndrome)  These exercises may help you when beginning to rehabilitate your injury. They may resolve your symptoms with or without further involvement from your physician, physical therapist or athletic trainer. While completing these exercises, remember:   Muscles can gain both the endurance and the strength needed for everyday activities through controlled exercises.  Complete these exercises as instructed by your physician, physical therapist or athletic trainer. Progress the resistance and repetitions only as guided.  STRENGTH - Towel Curls  Sit in a chair positioned on a non-carpeted surface.  Place your foot on a towel, keeping your heel  on the floor.  Pull the towel toward your heel by only curling your toes. Keep your heel on the floor. Repeat 3 times. Complete this exercise 2 times per day.  STRENGTH - Ankle Inversion  Secure one end of a rubber exercise band/tubing to a fixed object (table, pole). Loop the other end around your foot just before your toes.  Place your fists between your knees. This will focus your strengthening at your ankle.  Slowly, pull your big toe up and in, making sure the band/tubing is positioned to resist the entire motion.  Hold this position for 10 seconds.  Have your muscles resist the band/tubing as it slowly pulls your foot back to the starting position. Repeat 3 times. Complete this exercises 2 times per day.  Document Released: 12/09/2005 Document Revised: 03/02/2012 Document Reviewed: 03/23/2009 [employer] Patient Information 2014 [employer] Achilles Tendinitis  with Rehab Achilles tendinitis is a disorder of the Achilles tendon. The Achilles tendon connects the large calf muscles (Gastrocnemius and Soleus) to the heel bone (calcaneus). This tendon is sometimes called the heel cord. It is important for pushing-off and standing on your toes and is important for walking, running, or jumping. Tendinitis is often caused by overuse and repetitive microtrauma. SYMPTOMS  Pain, tenderness, swelling, warmth, and redness may occur over the Achilles tendon even at rest.  Pain with pushing off, or flexing or extending the ankle.  Pain that is worsened after or during activity. CAUSES   Overuse sometimes seen with rapid increase in exercise programs or in sports requiring running and jumping.  Poor physical conditioning (strength and flexibility or endurance).  Running sports, especially training running down hills.  Inadequate warm-up before practice or play or failure to stretch before participation.  Injury to the tendon. PREVENTION   Warm up and stretch before practice or  competition.  Allow time for adequate rest and recovery between practices and competition.  Keep up conditioning.  Keep up ankle and leg flexibility.  Improve or keep muscle strength and endurance.  Improve cardiovascular fitness.  Use proper technique.  Use proper equipment (shoes, skates).  To help prevent recurrence, taping, protective strapping, or an adhesive bandage may be recommended for several weeks after healing is complete. PROGNOSIS   Recovery may take weeks to several months to heal.  Longer recovery is expected if symptoms have been prolonged.  Recovery is usually quicker if the inflammation is due to a direct blow as compared with overuse or sudden strain. RELATED COMPLICATIONS   Healing time will be prolonged if the condition is not correctly treated. The injury must be given plenty of time to heal.  Symptoms can reoccur if activity is resumed too soon.  Untreated, tendinitis may increase the risk of tendon rupture requiring additional time for recovery   and possibly surgery. TREATMENT   The first treatment consists of rest anti-inflammatory medication, and ice to relieve the pain.  Stretching and strengthening exercises after resolution of pain will likely help reduce the risk of recurrence. Referral to a physical therapist or athletic trainer for further evaluation and treatment may be helpful.  A walking boot or cast may be recommended to rest the Achilles tendon. This can help break the cycle of inflammation and microtrauma.  Arch supports (orthotics) may be prescribed or recommended by your caregiver as an adjunct to therapy and rest.  Surgery to remove the inflamed tendon lining or degenerated tendon tissue is rarely necessary and has shown less than predictable results. MEDICATION   Nonsteroidal anti-inflammatory medications, such as aspirin and ibuprofen, may be used for pain and inflammation relief. Do not take within 7 days before surgery. Take  these as directed by your caregiver. Contact your caregiver immediately if any bleeding, stomach upset, or signs of allergic reaction occur. Other minor pain relievers, such as acetaminophen, may also be used.  Pain relievers may be prescribed as necessary by your caregiver. Do not take prescription pain medication for longer than 4 to 7 days. Use only as directed and only as much as you need.  Cortisone injections are rarely indicated. Cortisone injections may weaken tendons and predispose to rupture. It is better to give the condition more time to heal than to use them. HEAT AND COLD  Cold is used to relieve pain and reduce inflammation for acute and chronic Achilles tendinitis. Cold should be applied for 10 to 15 minutes every 2 to 3 hours for inflammation and pain and immediately after any activity that aggravates your symptoms. Use ice packs or an ice massage.  Heat may be used before performing stretching and strengthening activities prescribed by your caregiver. Use a heat pack or a warm soak. SEEK MEDICAL CARE IF:  Symptoms get worse or do not improve in 2 weeks despite treatment.  New, unexplained symptoms develop. Drugs used in treatment may produce side effects.  EXERCISES:  RANGE OF MOTION (ROM) AND STRETCHING EXERCISES - Achilles Tendinitis  These exercises may help you when beginning to rehabilitate your injury. Your symptoms may resolve with or without further involvement from your physician, physical therapist or athletic trainer. While completing these exercises, remember:   Restoring tissue flexibility helps normal motion to return to the joints. This allows healthier, less painful movement and activity.  An effective stretch should be held for at least 30 seconds.  A stretch should never be painful. You should only feel a gentle lengthening or release in the stretched tissue.  STRETCH  Gastroc, Standing   Place hands on wall.  Extend right / left leg, keeping the  front knee somewhat bent.  Slightly point your toes inward on your back foot.  Keeping your right / left heel on the floor and your knee straight, shift your weight toward the wall, not allowing your back to arch.  You should feel a gentle stretch in the right / left calf. Hold this position for 10 seconds. Repeat 3 times. Complete this stretch 2 times per day.  STRETCH  Soleus, Standing   Place hands on wall.  Extend right / left leg, keeping the other knee somewhat bent.  Slightly point your toes inward on your back foot.  Keep your right / left heel on the floor, bend your back knee, and slightly shift your weight over the back leg so that you feel a   gentle stretch deep in your back calf.  Hold this position for 10 seconds. Repeat 3 times. Complete this stretch 2 times per day.  STRETCH  Gastrocsoleus, Standing  Note: This exercise can place a lot of stress on your foot and ankle. Please complete this exercise only if specifically instructed by your caregiver.   Place the ball of your right / left foot on a step, keeping your other foot firmly on the same step.  Hold on to the wall or a rail for balance.  Slowly lift your other foot, allowing your body weight to press your heel down over the edge of the step.  You should feel a stretch in your right / left calf.  Hold this position for 10 seconds.  Repeat this exercise with a slight bend in your knee. Repeat 3 times. Complete this stretch 2 times per day.   STRENGTHENING EXERCISES - Achilles Tendinitis These exercises may help you when beginning to rehabilitate your injury. They may resolve your symptoms with or without further involvement from your physician, physical therapist or athletic trainer. While completing these exercises, remember:   Muscles can gain both the endurance and the strength needed for everyday activities through controlled exercises.  Complete these exercises as instructed by your physician,  physical therapist or athletic trainer. Progress the resistance and repetitions only as guided.  You may experience muscle soreness or fatigue, but the pain or discomfort you are trying to eliminate should never worsen during these exercises. If this pain does worsen, stop and make certain you are following the directions exactly. If the pain is still present after adjustments, discontinue the exercise until you can discuss the trouble with your clinician.  STRENGTH - Plantar-flexors   Sit with your right / left leg extended. Holding onto both ends of a rubber exercise band/tubing, loop it around the ball of your foot. Keep a slight tension in the band.  Slowly push your toes away from you, pointing them downward.  Hold this position for 10 seconds. Return slowly, controlling the tension in the band/tubing. Repeat 3 times. Complete this exercise 2 times per day.   STRENGTH - Plantar-flexors   Stand with your feet shoulder width apart. Steady yourself with a wall or table using as little support as needed.  Keeping your weight evenly spread over the width of your feet, rise up on your toes.*  Hold this position for 10 seconds. Repeat 3 times. Complete this exercise 2 times per day.  *If this is too easy, shift your weight toward your right / left leg until you feel challenged. Ultimately, you may be asked to do this exercise with your right / left foot only.  STRENGTH  Plantar-flexors, Eccentric  Note: This exercise can place a lot of stress on your foot and ankle. Please complete this exercise only if specifically instructed by your caregiver.   Place the balls of your feet on a step. With your hands, use only enough support from a wall or rail to keep your balance.  Keep your knees straight and rise up on your toes.  Slowly shift your weight entirely to your right / left toes and pick up your opposite foot. Gently and with controlled movement, lower your weight through your right /  left foot so that your heel drops below the level of the step. You will feel a slight stretch in the back of your calf at the end position.  Use the healthy leg to help rise up onto   the balls of both feet, then lower weight only on the right / left leg again. Build up to 15 repetitions. Then progress to 3 consecutive sets of 15 repetitions.*  After completing the above exercise, complete the same exercise with a slight knee bend (about 30 degrees). Again, build up to 15 repetitions. Then progress to 3 consecutive sets of 15 repetitions.* Perform this exercise 2 times per day.  *When you easily complete 3 sets of 15, your physician, physical therapist or athletic trainer may advise you to add resistance by wearing a backpack filled with additional weight.  STRENGTH - Plantar Flexors, Seated   Sit on a chair that allows your feet to rest flat on the ground. If necessary, sit at the edge of the chair.  Keeping your toes firmly on the ground, lift your right / left heel as far as you can without increasing any discomfort in your ankle. Repeat 3 times. Complete this exercise 2 times a day. .tfc

## 2019-01-13 NOTE — Progress Notes (Signed)
   Subjective:    Patient ID: Sandra Nichols, female    DOB: 11-Jun-1976, 43 y.o.   MRN: [id number]  HPI    Review of Systems  All other systems reviewed and are negative.      Objective:   Physical Exam        Assessment & Plan:

## 2019-01-13 NOTE — Progress Notes (Signed)
Subjective:   Patient ID: Sandra Nichols, female   DOB: 43 y.o.   MRN: [id number]   HPI patient presents stating she is having a lot of pain in her left heel both on the bottom and the back and it is been going on for a while and gotten worse over the last few months.  She states at times it burns and she also gets charley horses in her leg if she is been on her foot for periods of time or when sleeping.  Patient does not smoke and likes to be active    Review of Systems  All other systems reviewed and are negative.       Objective:  Physical Exam Vitals signs and nursing note reviewed.  Constitutional:      Appearance: She is well-developed.  Pulmonary:     Effort: Pulmonary effort is normal.  Musculoskeletal: Normal range of motion.  Skin:    General: Skin is warm.  Neurological:     Mental Status: She is alert.     Neurovascular status intact muscle strength is adequate range of motion within normal limits with patient noted to have exquisite discomfort plantar aspect left heel at the insertion point tendon into the calcaneus and also moderate discomfort posterior heel at the insertion of the tendon into the Achilles.  Patient has good digital perfusion is well oriented x3 with mild equinus condition noted and is walking with an abnormal gait pattern     Assessment:  Acute plantar fasciitis Achilles tendinitis left with possibility that the Achilles is compensatory for the plantar heel issue with large spur formation moderate depression of the arch     Plan:  H&P x-rays reviewed and today I went ahead and injected the plantar fascial left 3 mg Kenalog 5 mg Xylocaine and placed into an air fracture walker to completely immobilize the posterior heel and allow the plantar heel to rest.  Placed on diclofenac 75 mg twice daily gave instructions for ice therapy and heeled shoe when she returns and will be seen back 4 weeks or earlier if needed  X-rays indicate very large spur formation  plantar and posterior aspect left heel

## 2019-02-03 ENCOUNTER — Ambulatory Visit: Payer: BLUE CROSS/BLUE SHIELD | Admitting: Podiatry

## 2019-02-03 ENCOUNTER — Encounter: Payer: Self-pay | Admitting: Podiatry

## 2019-02-03 DIAGNOSIS — M7662 Achilles tendinitis, left leg: Secondary | ICD-10-CM

## 2019-02-03 DIAGNOSIS — M722 Plantar fascial fibromatosis: Secondary | ICD-10-CM

## 2019-02-03 MED ORDER — TRIAMCINOLONE ACETONIDE 10 MG/ML IJ SUSP
10.0000 mg | Freq: Once | INTRAMUSCULAR | Status: AC
  Administered 2019-02-03: 10 mg

## 2019-02-03 NOTE — Progress Notes (Signed)
Subjective:   Patient ID: Sandra Nichols, female   DOB: 43 y.o.   MRN: [id number]   HPI Patient states the bottom of my heel is feeling quite a bit better but I still have pain in the back and I done a good job of wearing my boot over that time.  States it is quite a bit better but the back is still quite bothersome   ROS      Objective:  Physical Exam  Neurovascular status intact with discomfort posterior aspect left heel lateral side at the insertion of the tendon into the calcaneus with no indications of strength loss or other pathology     Assessment:  Acute Achilles tendinitis left lateral side with pain with improved plantar fasciitis     Plan:  H&P reviewed both conditions and at this time about a focus on the Achilles and I did explain injection with risk.  Patient understands risk of rupture and at this point wants procedure and I did a sterile prep of the lateral side of the posterior left heel and carefully injected with 3 mg Dexasone Kenalog 5 mg Xylocaine keeping it away from the central and medial portion of the tendon.  I then reapplied air fracture walker that she will wear advised on ice and will see back again in 3 weeks to reevaluate

## 2019-02-12 ENCOUNTER — Ambulatory Visit: Payer: BLUE CROSS/BLUE SHIELD | Admitting: Family

## 2019-02-17 ENCOUNTER — Encounter: Payer: Self-pay | Admitting: Family

## 2019-02-17 ENCOUNTER — Ambulatory Visit: Payer: BLUE CROSS/BLUE SHIELD | Admitting: Family

## 2019-02-17 VITALS — BP 131/77 | HR 73 | Temp 98.3°F | Resp 16 | Ht 63.0 in | Wt 213.6 lb

## 2019-02-17 DIAGNOSIS — R21 Rash and other nonspecific skin eruption: Secondary | ICD-10-CM | POA: Diagnosis not present

## 2019-02-17 DIAGNOSIS — I10 Essential (primary) hypertension: Secondary | ICD-10-CM

## 2019-02-17 DIAGNOSIS — N926 Irregular menstruation, unspecified: Secondary | ICD-10-CM

## 2019-02-17 DIAGNOSIS — F418 Other specified anxiety disorders: Secondary | ICD-10-CM | POA: Diagnosis not present

## 2019-02-17 NOTE — Progress Notes (Signed)
Subjective:    Patient ID: Sandra Nichols, female    DOB: [birth year], 43 y.o.   MRN: [id number]  HPI Patient is a 43 year old female who presents today for follow-up.  depression and anxiety-last visit we discussed some work stress.  She was continued on her Prozac. Reports that she stopped taking Klonopin. Exercising more. This is helping her anxiety and mood.   Hypertension- last visit we added Toprol-XL 25 mg once daily.  She reports tolerating the medication without difficulty. BP Readings from Last 3 Encounters:  02/17/19 131/77  12/07/18 (!) 153/90  08/17/18 (!) 153/97   Skin rash-last visit she had a painful skin rash and we suspected shingles.  She reports that she has had similar outbreaks in the past when she has had stress.  She reports it took several weeks for it to completely heal.  She has no residual pain at this time.  Note some irregular menstrual bleeding and postcoital bleeding.  She is past due for her Pap smear. Review of Systems    see HPI  Past Medical History:  Diagnosis Date  . Anemia   . Breast cyst, left 07/25/2017   3 cysts. Resume screening in 1 yr per pt.  . Depression   . GERD (gastroesophageal reflux disease)   . Irritable bowel syndrome   . OCD (obsessive compulsive disorder)   . Panic attack      Social History   Socioeconomic History  . Marital status: Married    Spouse name: Not on file  . Number of children: Not on file  . Years of education: Not on file  . Highest education level: Not on file  Occupational History  . Not on file  Social Needs  . Financial resource strain: Not on file  . Food insecurity:    Worry: Not on file    Inability: Not on file  . Transportation needs:    Medical: Not on file    Non-medical: Not on file  Tobacco Use  . Smoking status: Never Smoker  . Smokeless tobacco: Never Used  Substance and Sexual Activity  . Alcohol use: Yes    Comment: 1 1/2 bottles of wine per weekend.  . Drug use: No  .  Sexual activity: Yes    Partners: Male    Birth control/protection: Surgical    Comment: ablation  Lifestyle  . Physical activity:    Days per week: Not on file    Minutes per session: Not on file  . Stress: Not on file  Relationships  . Social connections:    Talks on phone: Not on file    Gets together: Not on file    Attends religious service: Not on file    Active member of club or organization: Not on file    Attends meetings of clubs or organizations: Not on file    Relationship status: Not on file  . Intimate partner violence:    Fear of current or ex partner: Not on file    Emotionally abused: Not on file    Physically abused: Not on file    Forced sexual activity: Not on file  Other Topics Concern  . Not on file  Social History Narrative   Married   Works in Counselling psychologist work at [employer]   3 biological children   1996- son Ree Kida   2000- [name] son   2003-[name]      2 children "non-official foster children" teens, live with her   Enjoys watching  netflix, movies, spending time with her children, going out to eat   Completed a masters in IT management    Past Surgical History:  Procedure Laterality Date  . ENDOMETRIAL ABLATION    . GASTRIC ROUX-EN-Y  04/20/2012   Procedure: LAPAROSCOPIC ROUX-EN-Y GASTRIC BYPASS WITH UPPER ENDOSCOPY;  Surgeon: Atilano Ina, MD,FACS;  Location: [facility];  Service: General;;  . LASIK Bilateral 2015  . MYOMECTOMY    . TUBAL LIGATION  01/27/2002    Family History  Problem Relation Age of Onset  . Heart disease Mother   . Diabetes Mother   . Depression Mother        committed suicide at age 33  . Alcohol abuse Mother   . Hypertension Mother   . Alcohol abuse Father        died from murder  . Cancer Paternal Grandmother        colon  . Diabetes Sister   . CAD Sister        s/p bypass,   . Obesity Sister        weighs 400 lbs  . Heart disease Maternal Grandfather     Allergies  Allergen Reactions  . Epinephrine Other (See  Comments) and Palpitations    syncope  . Prednisone Other (See Comments)    Psychological rxn: pt reports "losing time"  . Influenza Vac Subunit Quad     Localized swelling at injection site. States she has tolerated flu mist in the past.  . Nsaids     Pt stated, "I have had gastric bypass surgery and can't take"    Current Outpatient Medications on File Prior to Visit  Medication Sig Dispense Refill  . amLODipine (NORVASC) 10 MG tablet TAKE 1 TABLET DAILY (THIS WILL REPLACE 5 MG DOSE) 90 tablet 0  . betamethasone dipropionate (DIPROLENE) 0.05 % cream Apply topically 2 (two) times daily. 60 g 0  . clotrimazole-betamethasone (LOTRISONE) cream Apply 1 application topically 2 (two) times daily. 15 g 0  . diclofenac (VOLTAREN) 75 MG EC tablet Take 1 tablet (75 mg total) by mouth 2 (two) times daily. 50 tablet 2  . FLUoxetine (PROZAC) 40 MG capsule TAKE 1 CAPSULE EVERY MORNING 90 capsule 0  . metoprolol succinate (TOPROL-XL) 50 MG 24 hr tablet Take 1 tablet (50 mg total) by mouth daily. Take with or immediately following a meal. 30 tablet 3   No current facility-administered medications on file prior to visit.     BP 131/77 (BP Location: Right Arm, Patient Position: Sitting, Cuff Size: Large)   Pulse 73   Temp 98.3 F (36.8 C) (Oral)   Resp 16   Ht 5\' 3"  (1.6 m)   Wt 213 lb 9.6 oz (96.9 kg)   SpO2 98%   BMI 37.84 kg/m    Objective:   Physical Exam Constitutional:      Appearance: She is well-developed.  Neck:     Musculoskeletal: Neck supple.     Thyroid: No thyromegaly.  Cardiovascular:     Rate and Rhythm: Normal rate and regular rhythm.     Heart sounds: Normal heart sounds. No murmur.  Pulmonary:     Effort: Pulmonary effort is normal. No respiratory distress.     Breath sounds: Normal breath sounds. No wheezing.  Skin:    General: Skin is warm and dry.  Neurological:     Mental Status: She is alert and oriented to person, place, and time.  Psychiatric:  Behavior: Behavior normal.        Thought Content: Thought content normal.        Judgment: Judgment normal.    Man: Check       Assessment & Plan:  Skin rash-this is resolved.  I wonder if it may have represented herpes type II outbreak.  We discussed checking a HSV titer and if positive consider suppressive therapy with Valtrex.  Hypertension- blood pressure is improved on beta-blocker.  Patient is tolerating.  Continue same.  Anxiety/depression-this is improved with exercise.  Continue current dose of Prozac.  Irregular menstrual bleeding-will have her return for a physical with Pap.  Consider GYN evaluation pending findings.  She is also requesting hormonal studies to see if she may be perimenopausal.

## 2019-02-18 LAB — FOLLICLE STIMULATING HORMONE: FSH: 12.3 m[IU]/mL

## 2019-02-18 LAB — ESTRADIOL: Estradiol: 44 pg/mL

## 2019-02-18 LAB — HSV 2 ANTIBODY, IGG: HSV 2 Glycoprotein G Ab, IgG: <0.9 index

## 2019-02-18 LAB — LUTEINIZING HORMONE: LH: 7.24 m[IU]/mL

## 2019-02-19 ENCOUNTER — Encounter: Payer: Self-pay | Admitting: Family

## 2019-03-03 ENCOUNTER — Encounter: Payer: Self-pay | Admitting: Podiatry

## 2019-03-03 ENCOUNTER — Ambulatory Visit: Payer: BLUE CROSS/BLUE SHIELD | Admitting: Podiatry

## 2019-03-03 ENCOUNTER — Other Ambulatory Visit: Payer: Self-pay

## 2019-03-03 DIAGNOSIS — M7662 Achilles tendinitis, left leg: Secondary | ICD-10-CM

## 2019-03-03 DIAGNOSIS — M722 Plantar fascial fibromatosis: Secondary | ICD-10-CM

## 2019-03-03 NOTE — Progress Notes (Signed)
Subjective:   Patient ID: Sandra Nichols, female   DOB: 43 y.o.   MRN: [id number]   HPI Patient states her heel seems to be improved at this time but she does have 2 separate problems and stated she walked around [place] without much discomfort   ROS      Objective:  Physical Exam  Neurovascular status intact with patient found to have quite a bit of improvement of the plantar heel and the posterior heel with mild discomfort upon deep palpation of the plantar heel left foot     Assessment:  Patient with 2 separate conditions which may be related with fasciitis tendinitis-like problem     Plan:  H&P conditions reviewed and at this point I have recommended the continuation of conservative care consisting of supportive shoes physical therapy night splint usage and reduced activity as needed.  I did encourage her to increase her activity but to be smart and I want to see her back if any issues were to occur and may require other treatments

## 2019-03-09 ENCOUNTER — Encounter: Payer: BLUE CROSS/BLUE SHIELD | Admitting: Family

## 2019-03-18 ENCOUNTER — Telehealth: Payer: Self-pay | Admitting: *Deleted

## 2019-03-18 NOTE — Telephone Encounter (Signed)
Left detail message explaining current options. Advised to call back and let us know how she will like to precede.

## 2019-03-18 NOTE — Telephone Encounter (Signed)
Since we are not bringing pt into the office with have 2 options:  1) wait until we are seeing patients again in the office   2) refer now to GYN- not sure if they are bringing in non-emergent visits but we can try.  Let me know her preference and we can schedule accordingly.

## 2019-03-18 NOTE — Telephone Encounter (Signed)
Sandra Nichols -- I spoke with pt about r/s her CPE. She mentioned that she has spoken with you about a possible polyp that she thinks she may have. States she has had one in the past and is having similar symptoms now of postcoital spotting. She states this was why she scheduled the cpe with pap to be done. Please advise how it is best to proceed with this.

## 2019-03-21 ENCOUNTER — Other Ambulatory Visit: Payer: Self-pay | Admitting: Family

## 2019-03-22 ENCOUNTER — Encounter: Payer: BLUE CROSS/BLUE SHIELD | Admitting: Family

## 2019-04-14 ENCOUNTER — Telehealth: Payer: Self-pay | Admitting: Family

## 2019-04-14 NOTE — Telephone Encounter (Signed)
Left msg to confirm appt canceled. Also mentioned that  VOV for cpe since she has [employer]

## 2019-04-21 ENCOUNTER — Encounter: Payer: BLUE CROSS/BLUE SHIELD | Admitting: Family

## 2019-05-26 ENCOUNTER — Other Ambulatory Visit: Payer: Self-pay | Admitting: Family

## 2019-06-11 ENCOUNTER — Other Ambulatory Visit: Payer: Self-pay

## 2019-06-11 MED ORDER — AMLODIPINE BESYLATE 10 MG PO TABS: 10.0000 mg | ORAL_TABLET | Freq: Every day | ORAL | 0 refills | Status: DC

## 2019-06-22 ENCOUNTER — Encounter: Payer: BLUE CROSS/BLUE SHIELD | Admitting: Family

## 2019-07-06 ENCOUNTER — Encounter: Payer: Self-pay | Admitting: Family

## 2019-07-06 ENCOUNTER — Other Ambulatory Visit (HOSPITAL_COMMUNITY)
Admission: RE | Admit: 2019-07-06 | Discharge: 2019-07-06 | Disposition: A | Discharge status: Home / Self Care | Payer: BC Managed Care – PPO | Source: Ambulatory Visit | Attending: Family | Admitting: Family

## 2019-07-06 ENCOUNTER — Ambulatory Visit (INDEPENDENT_AMBULATORY_CARE_PROVIDER_SITE_OTHER): Payer: BC Managed Care – PPO | Admitting: Family

## 2019-07-06 ENCOUNTER — Other Ambulatory Visit: Payer: Self-pay

## 2019-07-06 VITALS — BP 115/76 | HR 74 | Temp 98.7°F | Resp 16 | Ht 62.7 in | Wt 213.0 lb

## 2019-07-06 DIAGNOSIS — Z0000 Encounter for general adult medical examination without abnormal findings: Secondary | ICD-10-CM | POA: Diagnosis not present

## 2019-07-06 DIAGNOSIS — Z01419 Encounter for gynecological examination (general) (routine) without abnormal findings: Secondary | ICD-10-CM | POA: Insufficient documentation

## 2019-07-06 NOTE — Progress Notes (Signed)
Subjective:    Patient ID: Sandra Nichols, female    DOB: May 28, 1976, 43 y.o.   MRN: [id number]  HPI  Patient presents today for complete physical.  Immunizations: tdap 2016 Diet:  Healthy Wt Readings from Last 3 Encounters:  07/06/19 213 lb (96.6 kg)  02/17/19 213 lb 9.6 oz (96.9 kg)  12/07/18 211 lb (95.7 kg)  Exercise: regular exercise Pap Smear: 2017, normal per patient Mammogram: 10/19  HTN- maintained on amlodipine and metoprolol.   BP Readings from Last 3 Encounters:  07/06/19 115/76  02/17/19 131/77  12/07/18 (!) 153/90   Depression- reports stable on prozac.    Review of Systems  Constitutional: Negative for unexpected weight change.  HENT: Negative for hearing loss and rhinorrhea.   Eyes: Positive for visual disturbance (reports that she has appointment, think she needs readers ).  Respiratory: Negative for cough.   Cardiovascular: Negative for leg swelling.  Gastrointestinal:       Reports that she alternates between diarrhea/constipation since her gastric bypass  Genitourinary: Negative for dysuria, frequency and hematuria.       Notes some stress incontinence  Musculoskeletal: Positive for neck pain.  Skin: Negative for rash.  Neurological: Negative for headaches.  Hematological: Negative for adenopathy.  Psychiatric/Behavioral:       Denies depression/anxiety   Past Medical History:  Diagnosis Date   Anemia    Breast cyst, left 07/25/2017   3 cysts. Resume screening in 1 yr per pt.   Depression    GERD (gastroesophageal reflux disease)    Irritable bowel syndrome    OCD (obsessive compulsive disorder)    Panic attack      Social History   Socioeconomic History   Marital status: Married    Spouse name: Not on file   Number of children: Not on file   Years of education: Not on file   Highest education level: Not on file  Occupational History   Not on file  Social Needs   Financial resource strain: Not on file   Food insecurity     Worry: Not on file    Inability: Not on file   Transportation needs    Medical: Not on file    Non-medical: Not on file  Tobacco Use   Smoking status: Never Smoker   Smokeless tobacco: Never Used  Substance and Sexual Activity   Alcohol use: Yes    Comment: 1 1/2 bottles of wine per weekend.   Drug use: No   Sexual activity: Yes    Partners: Male    Birth control/protection: Surgical    Comment: ablation  Lifestyle   Physical activity    Days per week: Not on file    Minutes per session: Not on file   Stress: Not on file  Relationships   Social connections    Talks on phone: Not on file    Gets together: Not on file    Attends religious service: Not on file    Active member of club or organization: Not on file    Attends meetings of clubs or organizations: Not on file    Relationship status: Not on file   Intimate partner violence    Fear of current or ex partner: Not on file    Emotionally abused: Not on file    Physically abused: Not on file    Forced sexual activity: Not on file  Other Topics Concern   Not on file  Social History Narrative   Married  Works in Counselling psychologistT desk work at [employer]   3 biological children   1996- son Ree KidaJack   2000- [name] son   2003-[name]      2 children "non-official foster children" teens, live with her   Enjoys watching netflix, movies, spending time with her children, going out to eat   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    Past Surgical History:  Procedure Laterality Date   ENDOMETRIAL ABLATION     GASTRIC ROUX-EN-Y  04/20/2012   Procedure: LAPAROSCOPIC ROUX-EN-Y GASTRIC BYPASS WITH UPPER ENDOSCOPY;  Surgeon: Atilano InaEric M Wilson, MD,FACS;  Location: [facility];  Service: General;;   LASIK Bilateral 2015   MYOMECTOMY     TUBAL LIGATION  01/27/2002    Family History  Problem Relation Age of Onset   Heart disease Mother    Diabetes Mother    Depression Mother        committed suicide at age 45   Alcohol abuse Mother      Hypertension Mother    Alcohol abuse Father        died from murder   Cancer Paternal Grandmother        colon   Diabetes Sister    CAD Sister        s/p bypass,    Obesity Sister        weighs 400 lbs   Heart disease Maternal Grandfather     Allergies  Allergen Reactions   Epinephrine Other (See Comments) and Palpitations    syncope   Prednisone Other (See Comments)    Psychological rxn: pt reports "losing time"   Influenza Vac Subunit Quad     Localized swelling at injection site. States she has tolerated flu mist in the past.   Nsaids     Pt stated, "I have had gastric bypass surgery and can't take"    Current Outpatient Medications on File Prior to Visit  Medication Sig Dispense Refill   amLODipine (NORVASC) 10 MG tablet Take 1 tablet (10 mg total) by mouth daily. 90 tablet 0   betamethasone dipropionate (DIPROLENE) 0.05 % cream Apply topically 2 (two) times daily. 60 g 0   clotrimazole-betamethasone (LOTRISONE) cream Apply 1 application topically 2 (two) times daily. 15 g 0   diclofenac (VOLTAREN) 75 MG EC tablet Take 1 tablet (75 mg total) by mouth 2 (two) times daily. (Patient taking differently: Take 75 mg by mouth daily. ) 50 tablet 2   FLUoxetine (PROZAC) 40 MG capsule TAKE 1 CAPSULE EVERY MORNING 90 capsule 3   metoprolol succinate (TOPROL-XL) 50 MG 24 hr tablet Take 1 tablet (50 mg total) by mouth daily. Take with or immediately following a meal. 30 tablet 3   No current facility-administered medications on file prior to visit.     BP 115/76 (BP Location: Right Arm, Patient Position: Sitting, Cuff Size: Large)    Pulse 74    Temp 98.7 F (37.1 C) (Oral)    Resp 16    Ht 5' 2.7" (1.593 m)    Wt 213 lb (96.6 kg)    SpO2 98%    BMI 38.09 kg/m       Objective:   Physical Exam  Physical Exam  Constitutional: Overweight appearing white female. She is oriented to person, place, and time. She appears well-developed and well-nourished. No  distress.  HENT:  Head: Normocephalic and atraumatic.  Right Ear: Tympanic membrane and ear canal normal.  Left Ear: Tympanic membrane and ear canal normal.  Mouth/Throat: not  examined.  Pt wearing mask due to covid precautions Eyes: Pupils are equal, round, and reactive to light. No scleral icterus.  Neck: Normal range of motion. No thyromegaly present.  Cardiovascular: Normal rate and regular rhythm.   No murmur heard. Pulmonary/Chest: Effort normal and breath sounds normal. No respiratory distress. He has no wheezes. She has no rales. She exhibits no tenderness.  Abdominal: Soft. Bowel sounds are normal. She exhibits no distension and no mass. There is no tenderness. There is no rebound and no guarding.  Musculoskeletal: She exhibits no edema.  Lymphadenopathy:    She has no cervical adenopathy.  Neurological: She is alert and oriented to person, place, and time. She has normal patellar reflexes. She exhibits normal muscle tone. Coordination normal.  Skin: Skin is warm and dry.  Psychiatric: She has a normal mood and affect. Her behavior is normal. Judgment and thought content normal.  Breasts: Examined lying Right: Without masses, retractions, discharge or axillary adenopathy.  Left: Without masses, retractions, discharge or axillary adenopathy.  Inguinal/mons: Normal without inguinal adenopathy  External genitalia: Normal  BUS/Urethra/Skene's glands: Normal  Bladder: Normal  Vagina: Normal  Cervix: Normal  Uterus: normal in size, shape and contour. Midline and mobile  Adnexa/parametria:  Rt: Without masses or tenderness.  Lt: Without masses or tenderness.  Anus and perineum: Normal            Assessment & Plan:         Assessment & Plan:  Preventative care- discussed healthy diet, exercise and weight loss. Obtain routine lab work. mammo up to date. Immunizations up to date. Pap performed today.

## 2019-07-06 NOTE — Patient Instructions (Signed)
You can consider Revive removable bladder support (available on [facility]) for use when you exercise.  Please complete lab work prior to leaving.

## 2019-07-07 LAB — BASIC METABOLIC PANEL
BUN: 14 mg/dL (ref 6–23)
CO2: 24 mEq/L (ref 19–32)
Calcium: 9.1 mg/dL (ref 8.4–10.5)
Chloride: 104 mEq/L (ref 96–112)
Creatinine, Ser: 0.64 mg/dL (ref 0.40–1.20)
GFR: 100.16 mL/min (ref >60.00)
Glucose, Bld: 95 mg/dL (ref 70–99)
Potassium: 4.1 mEq/L (ref 3.5–5.1)
Sodium: 137 mEq/L (ref 135–145)

## 2019-07-07 LAB — CBC WITH DIFFERENTIAL/PLATELET
Basophils Absolute: 0.1 10*3/uL (ref 0.0–0.1)
Basophils Relative: 0.8 % (ref 0.0–3.0)
Eosinophils Absolute: 0 10*3/uL (ref 0.0–0.7)
Eosinophils Relative: 0.7 % (ref 0.0–5.0)
HCT: 37.4 % (ref 36.0–46.0)
Hemoglobin: 12.6 g/dL (ref 12.0–15.0)
Lymphocytes Relative: 17.9 % (ref 12.0–46.0)
Lymphs Abs: 1.2 10*3/uL (ref 0.7–4.0)
MCHC: 33.6 g/dL (ref 30.0–36.0)
MCV: 98.2 fl (ref 78.0–100.0)
Monocytes Absolute: 0.5 10*3/uL (ref 0.1–1.0)
Monocytes Relative: 7.1 % (ref 3.0–12.0)
Neutro Abs: 4.8 10*3/uL (ref 1.4–7.7)
Neutrophils Relative %: 73.5 % (ref 43.0–77.0)
Platelets: 301 10*3/uL (ref 150.0–400.0)
RBC: 3.8 Mil/uL — ABNORMAL LOW (ref 3.87–5.11)
RDW: 14.5 % (ref 11.5–15.5)
WBC: 6.6 10*3/uL (ref 4.0–10.5)

## 2019-07-07 LAB — HEPATIC FUNCTION PANEL
ALT: 23 U/L (ref 0–35)
AST: 23 U/L (ref 0–37)
Albumin: 4.3 g/dL (ref 3.5–5.2)
Alkaline Phosphatase: 77 U/L (ref 39–117)
Bilirubin, Direct: 0.1 mg/dL (ref 0.0–0.3)
Total Bilirubin: 0.5 mg/dL (ref 0.2–1.2)
Total Protein: 7 g/dL (ref 6.0–8.3)

## 2019-07-07 LAB — LIPID PANEL
Cholesterol: 172 mg/dL (ref 0–200)
HDL: 53.2 mg/dL (ref >39.00)
LDL Cholesterol: 102 mg/dL — ABNORMAL HIGH (ref 0–99)
NonHDL: 118.39
Total CHOL/HDL Ratio: 3
Triglycerides: 81 mg/dL (ref 0.0–149.0)
VLDL: 16.2 mg/dL (ref 0.0–40.0)

## 2019-07-07 LAB — TSH: TSH: 1.15 u[IU]/mL (ref 0.35–4.50)

## 2019-07-09 LAB — CYTOLOGY - PAP
Diagnosis: NEGATIVE
HPV: NOT DETECTED

## 2019-09-07 ENCOUNTER — Other Ambulatory Visit: Payer: Self-pay | Admitting: Family

## 2019-09-07 MED ORDER — METOPROLOL SUCCINATE ER 50 MG PO TB24: 50.0000 mg | ORAL_TABLET | Freq: Every day | ORAL | 3 refills | Status: DC

## 2019-09-07 NOTE — Telephone Encounter (Signed)
Copied from [place] [id number]. Topic: Quick Communication - Rx Refill/Question >> Sep 07, 2019  2:52 PM Leward Quan A wrote: Medication: metoprolol succinate (TOPROL-XL) 50 MG 24 hr tablet   Has the patient contacted their pharmacy? No. (Agent: If no, request that the patient contact the pharmacy for the refill.) (Agent: If yes, when and what did the pharmacy advise?)  Preferred Pharmacy (with phone number or street name): [facility] [place], [place] - [address] [phone] (Phone) [phone] (Fax)    Agent: Please be advised that RX refills may take up to 3 business days. We ask that you follow-up with your pharmacy.

## 2019-09-09 ENCOUNTER — Other Ambulatory Visit: Payer: Self-pay | Admitting: *Deleted

## 2019-09-09 MED ORDER — AMLODIPINE BESYLATE 10 MG PO TABS: 10.0000 mg | ORAL_TABLET | Freq: Every day | ORAL | 1 refills | Status: DC

## 2019-10-24 DIAGNOSIS — U071 COVID-19: Secondary | ICD-10-CM

## 2019-10-24 HISTORY — DX: COVID-19: U07.1

## 2019-11-15 DIAGNOSIS — U071 COVID-19: Secondary | ICD-10-CM | POA: Diagnosis not present

## 2020-01-11 ENCOUNTER — Encounter: Payer: Self-pay | Admitting: Family

## 2020-01-11 ENCOUNTER — Ambulatory Visit (INDEPENDENT_AMBULATORY_CARE_PROVIDER_SITE_OTHER): Payer: BC Managed Care – PPO | Admitting: Family

## 2020-01-11 ENCOUNTER — Other Ambulatory Visit: Payer: Self-pay

## 2020-01-11 VITALS — Ht 63.5 in | Wt 211.2 lb

## 2020-01-11 DIAGNOSIS — I10 Essential (primary) hypertension: Secondary | ICD-10-CM | POA: Diagnosis not present

## 2020-01-11 DIAGNOSIS — F418 Other specified anxiety disorders: Secondary | ICD-10-CM | POA: Diagnosis not present

## 2020-01-11 NOTE — Progress Notes (Signed)
Virtual Visit via Video Note  I connected with Sandra Nichols on 01/11/20 at  1:20 PM EST by a video enabled telemedicine application and verified that I am speaking with the correct person using two identifiers.  Location: Patient: home Provider: work   I discussed the limitations of evaluation and management by telemedicine and the availability of in person appointments. The patient expressed understanding and agreed to proceed.  History of Present Illness:  Patient is a 44 yr old female who presents today for followup.  Depression/anxiety- maintained on prozac 40mg .   HTN- maintained on toprol xl 50mg  and amlodipine 10mg .    BP Readings from Last 3 Encounters:  07/06/19 115/76  02/17/19 131/77  12/07/18 (!) 153/90    Had Covid in November. Reports that it was a mild case and she is fully recovered.  Past Medical History:  Diagnosis Date  . Anemia   . Breast cyst, left 07/25/2017   3 cysts. Resume screening in 1 yr per pt.  . Depression   . GERD (gastroesophageal reflux disease)   . Irritable bowel syndrome   . OCD (obsessive compulsive disorder)   . Panic attack      Social History   Socioeconomic History  . Marital status: Married    Spouse name: Not on file  . Number of children: Not on file  . Years of education: Not on file  . Highest education level: Not on file  Occupational History  . Not on file  Tobacco Use  . Smoking status: Never Smoker  . Smokeless tobacco: Never Used  Substance and Sexual Activity  . Alcohol use: Yes    Comment: 1 1/2 bottles of wine per weekend.  . Drug use: No  . Sexual activity: Yes    Partners: Male    Birth control/protection: Surgical    Comment: ablation  Other Topics Concern  . 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 Difficulty of Paying Living Expenses: Not on file  Food Insecurity:   . Worried About 2001 in the Last Year: Not on file  . Ran Out of Food in the Last Year: Not on file  Transportation Needs:   . Lack of Transportation (Medical): Not on file  . Lack of Transportation (Non-Medical): Not on file  Physical Activity:   . Days of Exercise per Week: Not on file  . Minutes of Exercise per Session: Not on file  Stress:   . Feeling of Stress : Not on file  Social Connections:   . Frequency of Communication with Friends and Family: Not on file  . Frequency of Social Gatherings with Friends and Family: Not on file  . Attends Religious Services: Not on file  . Active Member of Clubs or Organizations: Not on file  . Attends Scientist, water quality Meetings: Not on file  . Marital Status: Not on file  Intimate Partner Violence:   . Fear of Current or Ex-Partner: Not on file  . Emotionally Abused: Not on file  . Physically  Abused: Not on file  . Sexually Abused: Not on file    Past Surgical History:  Procedure Laterality Date  . ENDOMETRIAL ABLATION    . GASTRIC ROUX-EN-Y  04/20/2012   Procedure: LAPAROSCOPIC ROUX-EN-Y GASTRIC BYPASS WITH UPPER ENDOSCOPY;  Surgeon: Gayland Curry, MD,FACS;  Location: [facility];  Service: General;;  . LASIK Bilateral 2015  . MYOMECTOMY    . TUBAL LIGATION  01/27/2002    Family History  Problem Relation Age of Onset  . Heart disease Mother   . Diabetes Mother   . Depression Mother        committed suicide at age 65  . Alcohol abuse Mother   . Hypertension Mother   . Alcohol abuse Father        died from murder  . Cancer Paternal Grandmother        colon  . Diabetes Sister   . CAD Sister        s/p bypass,   . Obesity Sister        weighs 400 lbs  . Uterine  cancer Sister   . Heart disease Maternal Grandfather     Allergies  Allergen Reactions  . Epinephrine Other (See Comments) and Palpitations    syncope  . Prednisone Other (See Comments)    Psychological rxn: pt reports "losing time"  . Influenza Vac Subunit Quad     Localized swelling at injection site. States she has tolerated flu mist in the past.  . Nsaids     Pt stated, "I have had gastric bypass surgery and can't take"    Current Outpatient Medications on File Prior to Visit  Medication Sig Dispense Refill  . amLODipine (NORVASC) 10 MG tablet Take 1 tablet (10 mg total) by mouth daily. 90 tablet 1  . clotrimazole-betamethasone (LOTRISONE) cream Apply 1 application topically 2 (two) times daily. 15 g 0  . FLUoxetine (PROZAC) 40 MG capsule TAKE 1 CAPSULE EVERY MORNING 90 capsule 3  . metoprolol succinate (TOPROL-XL) 50 MG 24 hr tablet Take 1 tablet (50 mg total) by mouth daily. Take with or immediately following a meal. 30 tablet 3   No current facility-administered medications on file prior to visit.    Ht 5' 3.5" (1.613 m)   Wt 211 lb 3.2 oz (95.8 kg)   BMI 36.83 kg/m      Observations/Objective:   Gen: Awake, alert, no acute distress Resp: Breathing is even and non-labored Psych: calm/pleasant demeanor Neuro: Alert and Oriented x 3, + facial symmetry, speech is clear.   Assessment and Plan:  HTN-  She had bp checked recently at her dental office and was told that it "was fine."  Advised pt to call her dentist's office and request reading and send me this information via mychart. Will continue current meds.   Depression/anxiety- stable on current dose of prozac. Continue same.    Follow Up Instructions:    I discussed the assessment and treatment plan with the patient. The patient was provided an opportunity to ask questions and all were answered. The patient agreed with the plan and demonstrated an understanding of the instructions.   The patient was  advised to call back or seek an in-person evaluation if the symptoms worsen or if the condition fails to improve as anticipated.  Nance Pear, NP

## 2020-01-24 ENCOUNTER — Encounter: Payer: Self-pay | Admitting: Family

## 2020-01-26 ENCOUNTER — Other Ambulatory Visit: Payer: Self-pay

## 2020-01-26 MED ORDER — METOPROLOL SUCCINATE ER 50 MG PO TB24: 50.0000 mg | ORAL_TABLET | Freq: Every day | ORAL | 3 refills | Status: DC

## 2020-04-12 DIAGNOSIS — M9903 Segmental and somatic dysfunction of lumbar region: Secondary | ICD-10-CM | POA: Diagnosis not present

## 2020-04-12 DIAGNOSIS — M9902 Segmental and somatic dysfunction of thoracic region: Secondary | ICD-10-CM | POA: Diagnosis not present

## 2020-04-12 DIAGNOSIS — M9905 Segmental and somatic dysfunction of pelvic region: Secondary | ICD-10-CM | POA: Diagnosis not present

## 2020-04-12 DIAGNOSIS — M722 Plantar fascial fibromatosis: Secondary | ICD-10-CM | POA: Diagnosis not present

## 2020-04-19 DIAGNOSIS — M9902 Segmental and somatic dysfunction of thoracic region: Secondary | ICD-10-CM | POA: Diagnosis not present

## 2020-04-19 DIAGNOSIS — M722 Plantar fascial fibromatosis: Secondary | ICD-10-CM | POA: Diagnosis not present

## 2020-04-19 DIAGNOSIS — M9905 Segmental and somatic dysfunction of pelvic region: Secondary | ICD-10-CM | POA: Diagnosis not present

## 2020-04-19 DIAGNOSIS — M9903 Segmental and somatic dysfunction of lumbar region: Secondary | ICD-10-CM | POA: Diagnosis not present

## 2020-04-27 DIAGNOSIS — M722 Plantar fascial fibromatosis: Secondary | ICD-10-CM | POA: Diagnosis not present

## 2020-04-27 DIAGNOSIS — M9902 Segmental and somatic dysfunction of thoracic region: Secondary | ICD-10-CM | POA: Diagnosis not present

## 2020-04-27 DIAGNOSIS — M9905 Segmental and somatic dysfunction of pelvic region: Secondary | ICD-10-CM | POA: Diagnosis not present

## 2020-04-27 DIAGNOSIS — M9903 Segmental and somatic dysfunction of lumbar region: Secondary | ICD-10-CM | POA: Diagnosis not present

## 2020-05-11 DIAGNOSIS — M9902 Segmental and somatic dysfunction of thoracic region: Secondary | ICD-10-CM | POA: Diagnosis not present

## 2020-05-11 DIAGNOSIS — M9903 Segmental and somatic dysfunction of lumbar region: Secondary | ICD-10-CM | POA: Diagnosis not present

## 2020-05-11 DIAGNOSIS — M9905 Segmental and somatic dysfunction of pelvic region: Secondary | ICD-10-CM | POA: Diagnosis not present

## 2020-05-11 DIAGNOSIS — M722 Plantar fascial fibromatosis: Secondary | ICD-10-CM | POA: Diagnosis not present

## 2020-05-23 DIAGNOSIS — M9903 Segmental and somatic dysfunction of lumbar region: Secondary | ICD-10-CM | POA: Diagnosis not present

## 2020-05-23 DIAGNOSIS — M9905 Segmental and somatic dysfunction of pelvic region: Secondary | ICD-10-CM | POA: Diagnosis not present

## 2020-05-23 DIAGNOSIS — M9902 Segmental and somatic dysfunction of thoracic region: Secondary | ICD-10-CM | POA: Diagnosis not present

## 2020-05-23 DIAGNOSIS — M722 Plantar fascial fibromatosis: Secondary | ICD-10-CM | POA: Diagnosis not present

## 2020-05-24 DIAGNOSIS — D2261 Melanocytic nevi of right upper limb, including shoulder: Secondary | ICD-10-CM | POA: Diagnosis not present

## 2020-05-24 DIAGNOSIS — D1801 Hemangioma of skin and subcutaneous tissue: Secondary | ICD-10-CM | POA: Diagnosis not present

## 2020-05-24 DIAGNOSIS — D2262 Melanocytic nevi of left upper limb, including shoulder: Secondary | ICD-10-CM | POA: Diagnosis not present

## 2020-05-24 DIAGNOSIS — L400 Psoriasis vulgaris: Secondary | ICD-10-CM | POA: Diagnosis not present

## 2020-06-29 ENCOUNTER — Other Ambulatory Visit: Payer: Self-pay | Admitting: *Deleted

## 2020-06-29 MED ORDER — FLUOXETINE HCL 40 MG PO CAPS: 40.0000 mg | ORAL_CAPSULE | Freq: Every morning | ORAL | 0 refills | Status: DC

## 2020-06-29 NOTE — Telephone Encounter (Signed)
Patient advised of refill and need for appointment. She will call us back to make appointment once she gets her schedule.

## 2020-06-29 NOTE — Telephone Encounter (Signed)
Melissa -- pt last seen by you on 01/11/20 and has no follow ups on file.  When is she due for routine follow up? Received request from [facility] for Fluoxetine 40mg . Refill sent.

## 2020-06-29 NOTE — Telephone Encounter (Signed)
Please schedule a follow up appointment in the end of august.

## 2020-07-20 ENCOUNTER — Ambulatory Visit: Payer: BC Managed Care – PPO | Admitting: Family

## 2020-08-18 ENCOUNTER — Ambulatory Visit (INDEPENDENT_AMBULATORY_CARE_PROVIDER_SITE_OTHER): Payer: BC Managed Care – PPO | Admitting: Family

## 2020-08-18 ENCOUNTER — Other Ambulatory Visit: Payer: Self-pay

## 2020-08-18 ENCOUNTER — Encounter: Payer: Self-pay | Admitting: Family

## 2020-08-18 VITALS — BP 129/79 | HR 72 | Temp 98.6°F | Resp 16 | Wt 215.6 lb

## 2020-08-18 DIAGNOSIS — I10 Essential (primary) hypertension: Secondary | ICD-10-CM | POA: Diagnosis not present

## 2020-08-18 DIAGNOSIS — F418 Other specified anxiety disorders: Secondary | ICD-10-CM | POA: Diagnosis not present

## 2020-08-18 LAB — BASIC METABOLIC PANEL
BUN: 21 mg/dL (ref 7–25)
CO2: 25 mmol/L (ref 20–32)
Calcium: 9.6 mg/dL (ref 8.6–10.2)
Chloride: 102 mmol/L (ref 98–110)
Creat: 0.7 mg/dL (ref 0.50–1.10)
Glucose, Bld: 92 mg/dL (ref 65–99)
Potassium: 4.4 mmol/L (ref 3.5–5.3)
Sodium: 135 mmol/L (ref 135–146)

## 2020-08-18 MED ORDER — FLUOXETINE HCL 40 MG PO CAPS: 40.0000 mg | ORAL_CAPSULE | Freq: Every morning | ORAL | 1 refills | Status: DC

## 2020-08-18 MED ORDER — METOPROLOL SUCCINATE ER 50 MG PO TB24: 50.0000 mg | ORAL_TABLET | Freq: Every day | ORAL | 1 refills | Status: DC

## 2020-08-18 MED ORDER — AMLODIPINE BESYLATE 10 MG PO TABS: 10.0000 mg | ORAL_TABLET | Freq: Every day | ORAL | 1 refills | Status: DC

## 2020-08-18 NOTE — Progress Notes (Signed)
   Subjective:    Patient ID: Sandra Nichols, female    DOB: 08-Apr-1976, 44 y.o.   MRN: [id number]  HPI  Patient is   Review of Systems     Objective:   Physical Exam        Assessment & Plan:

## 2020-08-18 NOTE — Progress Notes (Signed)
Subjective:    Patient ID: Sandra Nichols, female    DOB: September 27, 1976, 44 y.o.   MRN: [id number]  HPI  Patient is a 44 yr old female who presents today for follow up.  HTN- she is maintained on toprol xl 50mg  and amlodipine 10mg .   BP Readings from Last 3 Encounters:  08/18/20 129/79  07/06/19 115/76  02/17/19 131/77   Depression- maintained on prozac 40 mg.  Reports mood has been stable.   Wt Readings from Last 3 Encounters:  08/18/20 215 lb 9.6 oz (97.8 kg)  01/11/20 211 lb 3.2 oz (95.8 kg)  07/06/19 213 lb (96.6 kg)     Review of Systems See HPI  Past Medical History:  Diagnosis Date  . Anemia   . Breast cyst, left 07/25/2017   3 cysts. Resume screening in 1 yr per pt.  07/08/19 COVID-19 10/2019  . Depression   . GERD (gastroesophageal reflux disease)   . Irritable bowel syndrome   . OCD (obsessive compulsive disorder)   . Panic attack      Social History   Socioeconomic History  . Marital status: Married    Spouse name: Not on file  . Number of children: Not on file  . Years of education: Not on file  . Highest education level: Not on file  Occupational History  . Not on file  Tobacco Use  . Smoking status: Never Smoker  . Smokeless tobacco: Never Used  Vaping Use  . Vaping Use: Never used  Substance and Sexual Activity  . Alcohol use: Yes    Comment: 1 1/2 bottles of wine per weekend.  . Drug use: No  . Sexual activity: Yes    Partners: Male    Birth control/protection: Surgical    Comment: ablation  Other Topics Concern  . Not on file  Social History Narrative   Married   Works in [employer] work at 11/2019   3 biological children   1996- son [employer]   2000- [name] son   2003-[name]      2 children "non-official foster children" teens, live with her   Enjoys watching netflix, movies, spending time with her children, going out to eat   Completed a [employer] in 2001   Social Determinants of Health   Financial Resource Strain:   . Difficulty of  Paying Living Expenses: Not on file  Food Insecurity:   . Worried About Scientist, water quality in the Last Year: Not on file  . Ran Out of Food in the Last Year: Not on file  Transportation Needs:   . Lack of Transportation (Medical): Not on file  . Lack of Transportation (Non-Medical): Not on file  Physical Activity:   . Days of Exercise per Week: Not on file  . Minutes of Exercise per Session: Not on file  Stress:   . Feeling of Stress : Not on file  Social Connections:   . Frequency of Communication with Friends and Family: Not on file  . Frequency of Social Gatherings with Friends and Family: Not on file  . Attends Religious Services: Not on file  . Active Member of Clubs or Organizations: Not on file  . Attends Museum/gallery curator Meetings: Not on file  . Marital Status: Not on file  Intimate Partner Violence:   . Fear of Current or Ex-Partner: Not on file  . Emotionally Abused: Not on file  . Physically Abused: Not on file  . Sexually Abused: Not on file  Past Surgical History:  Procedure Laterality Date  . ENDOMETRIAL ABLATION    . GASTRIC ROUX-EN-Y  04/20/2012   Procedure: LAPAROSCOPIC ROUX-EN-Y GASTRIC BYPASS WITH UPPER ENDOSCOPY;  Surgeon: Atilano Ina, MD,FACS;  Location: [facility];  Service: General;;  . LASIK Bilateral 2015  . MYOMECTOMY    . TUBAL LIGATION  01/27/2002    Family History  Problem Relation Age of Onset  . Heart disease Mother   . Diabetes Mother   . Depression Mother        committed suicide at age 51  . Alcohol abuse Mother   . Hypertension Mother   . Alcohol abuse Father        died from murder  . Cancer Paternal Grandmother        colon  . Diabetes Sister   . CAD Sister        s/p bypass,   . Obesity Sister        weighs 400 lbs  . Uterine cancer Sister   . Heart disease Maternal Grandfather     Allergies  Allergen Reactions  . Epinephrine Other (See Comments) and Palpitations    syncope  . Prednisone Other (See Comments)     Psychological rxn: pt reports "losing time"  . Influenza Vac Subunit Quad     Localized swelling at injection site. States she has tolerated flu mist in the past.  . Nsaids     Pt stated, "I have had gastric bypass surgery and can't take"    Current Outpatient Medications on File Prior to Visit  Medication Sig Dispense Refill  . amLODipine (NORVASC) 10 MG tablet Take 1 tablet (10 mg total) by mouth daily. 90 tablet 1  . FLUoxetine (PROZAC) 40 MG capsule Take 1 capsule (40 mg total) by mouth every morning. 30 capsule 0  . metoprolol succinate (TOPROL-XL) 50 MG 24 hr tablet Take 1 tablet (50 mg total) by mouth daily. Take with or immediately following a meal. 30 tablet 3  . clotrimazole-betamethasone (LOTRISONE) cream Apply 1 application topically 2 (two) times daily. 15 g 0   No current facility-administered medications on file prior to visit.    BP 129/79 (BP Location: Right Arm, Patient Position: Sitting, Cuff Size: Large)   Pulse 72   Temp 98.6 F (37 C) (Oral)   Resp 16   Wt 215 lb 9.6 oz (97.8 kg)   SpO2 99%   BMI 37.59 kg/m       Objective:   Physical Exam Constitutional:      Appearance: She is well-developed.  Neck:     Thyroid: No thyromegaly.  Cardiovascular:     Rate and Rhythm: Normal rate and regular rhythm.     Heart sounds: Normal heart sounds. No murmur heard.   Pulmonary:     Effort: Pulmonary effort is normal. No respiratory distress.     Breath sounds: Normal breath sounds. No wheezing.  Musculoskeletal:     Cervical back: Neck supple.  Skin:    General: Skin is warm and dry.  Neurological:     Mental Status: She is alert and oriented to person, place, and time.  Psychiatric:        Behavior: Behavior normal.        Thought Content: Thought content normal.        Judgment: Judgment normal.           Assessment & Plan:  HTN- bp stable on current medication. Obtain follow up bmet. Will continue current  meds.  Depression/anxiety- stable on  prozac. Continue same.  This visit occurred during the SARS-CoV-2 public health emergency.  Safety protocols were in place, including screening questions prior to the visit, additional usage of staff PPE, and extensive cleaning of exam room while observing appropriate contact time as indicated for disinfecting solutions.

## 2020-08-18 NOTE — Patient Instructions (Signed)
Please complete lab work prior to leaving.   

## 2021-05-08 DIAGNOSIS — J0100 Acute maxillary sinusitis, unspecified: Secondary | ICD-10-CM | POA: Diagnosis not present

## 2021-05-21 DIAGNOSIS — L259 Unspecified contact dermatitis, unspecified cause: Secondary | ICD-10-CM | POA: Diagnosis not present

## 2021-06-06 DIAGNOSIS — D2262 Melanocytic nevi of left upper limb, including shoulder: Secondary | ICD-10-CM | POA: Diagnosis not present

## 2021-06-06 DIAGNOSIS — D1801 Hemangioma of skin and subcutaneous tissue: Secondary | ICD-10-CM | POA: Diagnosis not present

## 2021-06-06 DIAGNOSIS — D2261 Melanocytic nevi of right upper limb, including shoulder: Secondary | ICD-10-CM | POA: Diagnosis not present

## 2021-06-06 DIAGNOSIS — D225 Melanocytic nevi of trunk: Secondary | ICD-10-CM | POA: Diagnosis not present

## 2021-08-22 ENCOUNTER — Other Ambulatory Visit: Payer: Self-pay | Admitting: Family

## 2021-09-05 ENCOUNTER — Ambulatory Visit (INDEPENDENT_AMBULATORY_CARE_PROVIDER_SITE_OTHER): Payer: BC Managed Care – PPO | Admitting: Family

## 2021-09-05 ENCOUNTER — Other Ambulatory Visit: Payer: Self-pay

## 2021-09-05 VITALS — BP 142/89 | HR 69 | Temp 98.6°F | Resp 16 | Ht 63.0 in | Wt 211.4 lb

## 2021-09-05 DIAGNOSIS — Z0000 Encounter for general adult medical examination without abnormal findings: Secondary | ICD-10-CM | POA: Diagnosis not present

## 2021-09-05 DIAGNOSIS — I10 Essential (primary) hypertension: Secondary | ICD-10-CM | POA: Diagnosis not present

## 2021-09-05 DIAGNOSIS — F418 Other specified anxiety disorders: Secondary | ICD-10-CM

## 2021-09-05 LAB — COMPREHENSIVE METABOLIC PANEL
ALT: 18 U/L (ref 0–35)
AST: 15 U/L (ref 0–37)
Albumin: 4.3 g/dL (ref 3.5–5.2)
Alkaline Phosphatase: 99 U/L (ref 39–117)
BUN: 14 mg/dL (ref 6–23)
CO2: 26 mEq/L (ref 19–32)
Calcium: 9.4 mg/dL (ref 8.4–10.5)
Chloride: 103 mEq/L (ref 96–112)
Creatinine, Ser: 0.64 mg/dL (ref 0.40–1.20)
GFR: 105.12 mL/min (ref >60.00)
Glucose, Bld: 96 mg/dL (ref 70–99)
Potassium: 4 mEq/L (ref 3.5–5.1)
Sodium: 138 mEq/L (ref 135–145)
Total Bilirubin: 0.7 mg/dL (ref 0.2–1.2)
Total Protein: 7.3 g/dL (ref 6.0–8.3)

## 2021-09-05 MED ORDER — AMLODIPINE BESYLATE 10 MG PO TABS: 10.0000 mg | ORAL_TABLET | Freq: Every day | ORAL | 1 refills | Status: DC

## 2021-09-05 MED ORDER — OMEPRAZOLE 40 MG PO CPDR: 40.0000 mg | DELAYED_RELEASE_CAPSULE | Freq: Every day | ORAL | 3 refills | Status: DC

## 2021-09-05 MED ORDER — FLUOXETINE HCL 40 MG PO CAPS: 40.0000 mg | ORAL_CAPSULE | Freq: Every morning | ORAL | 1 refills | Status: DC

## 2021-09-05 MED ORDER — METOPROLOL SUCCINATE ER 50 MG PO TB24: 50.0000 mg | ORAL_TABLET | Freq: Every day | ORAL | 1 refills | Status: DC

## 2021-09-05 NOTE — Patient Instructions (Signed)
Start omeprazole 40mg  once daily.  Complete lab work prior to leaving.

## 2021-09-05 NOTE — Assessment & Plan Note (Signed)
Stable, continue prozac 40mg  once daily.

## 2021-09-05 NOTE — Assessment & Plan Note (Addendum)
BP Readings from Last 3 Encounters:  09/05/21 (!) 142/89  08/18/20 129/79  07/06/19 115/76   Continue toprol xl 50mg , Amlodipine 10mg  once daily.  Fair BP.

## 2021-09-05 NOTE — Progress Notes (Signed)
Subjective:   By signing my name below, I, Sandra Nichols, attest that this documentation has been prepared under the direction and in the presence of Sandra Nichols. 09/05/2021     Patient ID: Sandra Nichols, female    DOB: [birth year], 45 y.o.   MRN: [id number]  Chief Complaint  Patient presents with   Annual Exam         HPI Patient is in today for a office visit.   Heart burn- She reports having heart burn at night for the past couple of weeks. She describes it as feeling like burning sensation with her breathe being taken away. She has tried 40 mg omeprazole PO to manage her symptoms but found mild relief. She is requesting a refill on it as well. She reports that she regularly drinks soda and thinks it may contribute to her symptoms.  Depression- She reports due to feelings of inadequacy at work her mood has worsened. She notes that her home life is doing well. She continues taking 40 mg Prozac daily PO and reports no new issues while taking it. She is requesting a refill on it as well.  Blood pressure- Her blood pressure is elevated during this visit. She continue staking 10 mg amlodipine daily PO, 50 mg metoprolol succinate daily PO and reports no new issues while taking them. She is requesting a refill on them as well.  BP Readings from Last 3 Encounters:  09/05/21 (!) 142/89  08/18/20 129/79  07/06/19 115/76   Pulse Readings from Last 3 Encounters:  09/05/21 69  08/18/20 72  07/06/19 74   Immunizations: She reports having 2 Covid-19 vaccines. She is interested in getting the 3rd Covid-19 booster vaccine and the new booster vaccine after it releases in the fall season. She is interested in getting the flu vaccine and is planning on getting it at a later time.  Diet: She is not maintaining a healthy diet at this time. Colonoscopy: Not yet completed. She is interested in setting up an appointment to get it completed.    Health Maintenance Due  Topic Date Due   HIV  Screening  Never done   Hepatitis C Screening  Never done   COLONOSCOPY (Pts 45-39yr Insurance coverage will need to be confirmed)  Never done   COVID-19 Vaccine (3 - Booster for [place]orterseries) 09/06/2020    Past Medical History:  Diagnosis Date   Anemia    Breast cyst, left 07/25/2017   3 cysts. Resume screening in 1 yr per pt.   COVID-19 10/2019   Depression    GERD (gastroesophageal reflux disease)    Irritable bowel syndrome    OCD (obsessive compulsive disorder)    Panic attack     Past Surgical History:  Procedure Laterality Date   ENDOMETRIAL ABLATION     GASTRIC ROUX-EN-Y  04/20/2012   Procedure: LAPAROSCOPIC ROUX-EN-Y GASTRIC BYPASS WITH UPPER ENDOSCOPY;  Surgeon: EGayland Curry MD,FACS;  Location: [facility];  Service: General;;   LASIK Bilateral 2015   MYOMECTOMY     TUBAL LIGATION  01/27/2002    Family History  Problem Relation Age of Onset   Heart disease Mother    Diabetes Mother    Depression Mother        committed suicide at age 47  Alcohol abuse Mother    Hypertension Mother    Alcohol abuse Father        died from murder   Cancer Paternal Grandmother  colon   Diabetes Sister    CAD Sister        s/p bypass,    Obesity Sister        weighs 400 lbs   Uterine cancer Sister    Heart disease Maternal Grandfather     Social History   Socioeconomic History   Marital status: Married    Spouse name: Not on file   Number of children: Not on file   Years of education: Not on file   Highest education level: Not on file  Occupational History   Not on file  Tobacco Use   Smoking status: Never   Smokeless tobacco: Never  Vaping Use   Vaping Use: Never used  Substance and Sexual Activity   Alcohol use: Yes    Comment: 1 1/2 bottles of wine per weekend.   Drug use: No   Sexual activity: Yes    Partners: Male    Birth control/protection: Surgical    Comment: ablation  Other Topics Concern   Not on file  Social History Narrative   Married    Works in Optometrist work at [employer]   3 biological children   1996- son [name]   2000- [name] son   2003-[name]      2 children "non-official foster children" teens, live with her   Enjoys watching netflix, movies, spending time with her children, going out to eat   Completed a Oceanographer in [employer]   Social Determinants of Health   Financial Resource Strain: Not on file  Food Insecurity: Not on file  Transportation Needs: Not on file  Physical Activity: Not on file  Stress: Not on file  Social Connections: Not on file  Intimate Partner Violence: Not on file    Outpatient Medications Prior to Visit  Medication Sig Dispense Refill   amLODipine (NORVASC) 10 MG tablet Take 1 tablet (10 mg total) by mouth daily. 90 tablet 1   FLUoxetine (PROZAC) 40 MG capsule Take 1 capsule (40 mg total) by mouth every morning. 90 capsule 1   metoprolol succinate (TOPROL-XL) 50 MG 24 hr tablet Take 1 tablet (50 mg total) by mouth daily. Take with or immediately following a meal. 90 tablet 1   No facility-administered medications prior to visit.    Allergies  Allergen Reactions   Epinephrine Other (See Comments) and Palpitations    syncope   Prednisone Other (See Comments)    Psychological rxn: pt reports "losing time"   Influenza Vac Subunit Quad     Localized swelling at injection site. States she has tolerated flu mist in the past.   Nsaids     Pt stated, "I have had gastric bypass surgery and can't take"    Review of Systems  Gastrointestinal:  Positive for heartburn.  Psychiatric/Behavioral:  Positive for depression.       Objective:    Physical Exam Constitutional:      General: She is not in acute distress.    Appearance: Normal appearance. She is not ill-appearing.  HENT:     Head: Normocephalic and atraumatic.     Right Ear: External ear normal.     Left Ear: External ear normal.  Eyes:     Extraocular Movements: Extraocular movements intact.     Pupils: Pupils are  equal, round, and reactive to light.  Cardiovascular:     Rate and Rhythm: Normal rate and regular rhythm.     Heart sounds: Normal heart sounds. No murmur heard.  No gallop.  Pulmonary:     Effort: Pulmonary effort is normal. No respiratory distress.     Breath sounds: Normal breath sounds. No wheezing or rales.  Lymphadenopathy:     Cervical: No cervical adenopathy.  Skin:    General: Skin is warm and dry.  Neurological:     Mental Status: She is alert and oriented to person, place, and time.  Psychiatric:        Behavior: Behavior normal.    BP (!) 142/89 (BP Location: Right Arm, Patient Position: Sitting, Cuff Size: Large)   Pulse 69   Temp 98.6 F (37 C) (Oral)   Resp 16   Ht '5\' 3"'  (1.6 m)   Wt 211 lb 6.4 oz (95.9 kg)   SpO2 99%   BMI 37.45 kg/m  Wt Readings from Last 3 Encounters:  09/05/21 211 lb 6.4 oz (95.9 kg)  08/18/20 215 lb 9.6 oz (97.8 kg)  01/11/20 211 lb 3.2 oz (95.8 kg)       Assessment & Plan:   Problem List Items Addressed This Visit       Unprioritized   Hypertension    BP Readings from Last 3 Encounters:  09/05/21 (!) 142/89  08/18/20 129/79  07/06/19 115/76  Continue toprol xl 90m, Amlodipine 132monce daily.       Relevant Medications   amLODipine (NORVASC) 10 MG tablet   metoprolol succinate (TOPROL-XL) 50 MG 24 hr tablet   Other Relevant Orders   Comp Met (CMET)   Depression with anxiety    Stable, continue prozac 4067mnce daily.        Relevant Medications   FLUoxetine (PROZAC) 40 MG capsule   Other Visit Diagnoses     Preventative health care    -  Primary   Relevant Orders   Ambulatory referral to Gastroenterology        Meds ordered this encounter  Medications   amLODipine (NORVASC) 10 MG tablet    Sig: Take 1 tablet (10 mg total) by mouth daily.    Dispense:  90 tablet    Refill:  1   FLUoxetine (PROZAC) 40 MG capsule    Sig: Take 1 capsule (40 mg total) by mouth every morning.    Dispense:  90 capsule     Refill:  1   metoprolol succinate (TOPROL-XL) 50 MG 24 hr tablet    Sig: Take 1 tablet (50 mg total) by mouth daily. Take with or immediately following a meal.    Dispense:  90 tablet    Refill:  1   omeprazole (PRILOSEC) 40 MG capsule    Sig: Take 1 capsule (40 mg total) by mouth daily.    Dispense:  30 capsule    Refill:  3    Order Specific Question:   Supervising Provider    Answer:   BLYPenni Homans[[id number]]    I, MelDebbrah Alar, personally preformed the services described in this documentation.  All medical record entries made by the scribe were at my direction and in my presence.  I have reviewed the chart and discharge instructions (if applicable) and agree that the record reflects my personal performance and is accurate and complete. 09/05/2021   I,Sandra Nichols,acting as a scribe for MelNance PearP.,have documented all relevant documentation on the behalf of MelNance PearP,as directed by  MelNance PearP while in the presence of MelNance PearP.   MelNance PearP

## 2021-10-16 ENCOUNTER — Encounter: Payer: BC Managed Care – PPO | Admitting: Family

## 2021-10-23 ENCOUNTER — Other Ambulatory Visit: Payer: Self-pay

## 2021-10-24 ENCOUNTER — Ambulatory Visit (INDEPENDENT_AMBULATORY_CARE_PROVIDER_SITE_OTHER): Payer: BC Managed Care – PPO | Admitting: Family

## 2021-10-24 ENCOUNTER — Encounter: Payer: Self-pay | Admitting: Family

## 2021-10-24 VITALS — BP 130/76 | HR 59 | Temp 98.3°F | Resp 16 | Ht 63.0 in | Wt 205.8 lb

## 2021-10-24 DIAGNOSIS — Z0000 Encounter for general adult medical examination without abnormal findings: Secondary | ICD-10-CM

## 2021-10-24 DIAGNOSIS — I10 Essential (primary) hypertension: Secondary | ICD-10-CM | POA: Diagnosis not present

## 2021-10-24 DIAGNOSIS — Z1159 Encounter for screening for other viral diseases: Secondary | ICD-10-CM | POA: Diagnosis not present

## 2021-10-24 DIAGNOSIS — Z9884 Bariatric surgery status: Secondary | ICD-10-CM | POA: Diagnosis not present

## 2021-10-24 DIAGNOSIS — Z114 Encounter for screening for human immunodeficiency virus [HIV]: Secondary | ICD-10-CM

## 2021-10-24 DIAGNOSIS — Z23 Encounter for immunization: Secondary | ICD-10-CM

## 2021-10-24 LAB — COMPREHENSIVE METABOLIC PANEL
ALT: 17 U/L (ref 0–35)
AST: 16 U/L (ref 0–37)
Albumin: 4.2 g/dL (ref 3.5–5.2)
Alkaline Phosphatase: 82 U/L (ref 39–117)
BUN: 14 mg/dL (ref 6–23)
CO2: 27 mEq/L (ref 19–32)
Calcium: 9.3 mg/dL (ref 8.4–10.5)
Chloride: 104 mEq/L (ref 96–112)
Creatinine, Ser: 0.65 mg/dL (ref 0.40–1.20)
GFR: 104.63 mL/min (ref >60.00)
Glucose, Bld: 91 mg/dL (ref 70–99)
Potassium: 4.1 mEq/L (ref 3.5–5.1)
Sodium: 139 mEq/L (ref 135–145)
Total Bilirubin: 0.5 mg/dL (ref 0.2–1.2)
Total Protein: 6.7 g/dL (ref 6.0–8.3)

## 2021-10-24 LAB — VITAMIN B12: Vitamin B-12: 62 pg/mL — ABNORMAL LOW (ref 211–911)

## 2021-10-24 LAB — LIPID PANEL
Cholesterol: 155 mg/dL (ref 0–200)
HDL: 46.3 mg/dL (ref >39.00)
LDL Cholesterol: 91 mg/dL (ref 0–99)
NonHDL: 108.54
Total CHOL/HDL Ratio: 3
Triglycerides: 89 mg/dL (ref 0.0–149.0)
VLDL: 17.8 mg/dL (ref 0.0–40.0)

## 2021-10-24 LAB — VITAMIN D 25 HYDROXY (VIT D DEFICIENCY, FRACTURES): VITD: 15.04 ng/mL — ABNORMAL LOW (ref 30.00–100.00)

## 2021-10-24 LAB — HEPATITIS C ANTIBODY
Hepatitis C Ab: NONREACTIVE
SIGNAL TO CUT-OFF: 0.07 (ref <1.00)

## 2021-10-24 LAB — HIV ANTIBODY (ROUTINE TESTING W REFLEX): HIV 1&2 Ab, 4th Generation: NONREACTIVE

## 2021-10-24 NOTE — Assessment & Plan Note (Addendum)
Wt Readings from Last 3 Encounters:  10/24/21 205 lb 12.8 oz (93.4 kg)  09/05/21 211 lb 6.4 oz (95.9 kg)  08/18/20 215 lb 9.6 oz (97.8 kg)   Working hard on diet.  Continue along with exercise. Havrix #2 today.  Colo scheduled.  Declines flu shot. Recommended covid bivalent booster at the pharmacy. Due for mammo. Pap up to date.

## 2021-10-24 NOTE — Progress Notes (Signed)
Subjective:   By signing my name below, I, [name], attest that this documentation has been prepared under the direction and in the presence of [name]. 10/24/2021     Patient ID: Sandra Nichols, female    DOB: [birth year], 45 y.o.   MRN: [id number]  Chief Complaint  Patient presents with   Annual Exam    Doing well , minor complaints    HPI Patient is in today for a comprehensive physical exam.   Menstrual cycle- She reports no longer having menstrual cycles.   CPE She denies having any unexpected weight change, ear pain, hearing loss and rhinorrhea, visual disturbance, cough, chest pain and leg swelling, nausea, vomiting, diarrhea, constipation, blood in stool, or dysuria and frequency, for myalgias and arthralgias, rash, headaches, adenopathy, depression or anxiety at this time. Social history: She has no recent changes to her family medical history. She has no recent surgical procedures this past year. She does not use alcohol. She does not use drugs. She does not use tobacco or vaping products. She has one female partner. She continues working at [employer].  Immunizations: She has 3 Hotel manager vaccines. She was informed of the bivalent Covid-19 vaccine and is interested in recieving it at another time. She is UTD on tetanus vaccine. She is due for the second hepatitis A vaccine and is interested in receiving the vaccine during this visit. She is interested in completing HIV and hepatitis C screening during her next ab work.  Declines flu shot.  Diet: She is managing a healthy diet. She is following the recommend diet since her gastric bypass surgery. She reports losing weight since starting her diet.  Exercise: She is participating in regular exercise.  Colonoscopy: Not yet completed. She has an upcomming appointment on 11/19/2021.  Pap Smear: Last completed 07/06/2019. Results are normal. Repeat in 3 years.  Mammogram: Last completed 09/24/2018. Results showed possible  mass in left breast.  Dental: She is UTD on dental care.  Vision: She is UTD on vision care.    Health Maintenance Due  Topic Date Due   Hepatitis C Screening  Never done   COLONOSCOPY (Pts 45-48yrs Insurance coverage will need to be confirmed)  Never done   COVID-19 Vaccine (4 - Booster for [employer] series) 01/27/2021    Past Medical History:  Diagnosis Date   Anemia    Breast cyst, left 07/25/2017   3 cysts. Resume screening in 1 yr per pt.   COVID-19 10/2019   Depression    GERD (gastroesophageal reflux disease)    Irritable bowel syndrome    OCD (obsessive compulsive disorder)    Panic attack     Past Surgical History:  Procedure Laterality Date   ENDOMETRIAL ABLATION     GASTRIC ROUX-EN-Y  04/20/2012   Procedure: LAPAROSCOPIC ROUX-EN-Y GASTRIC BYPASS WITH UPPER ENDOSCOPY;  Surgeon: Gayland Curry, MD,FACS;  Location: [facility];  Service: General;;   LASIK Bilateral 2015   MYOMECTOMY     TUBAL LIGATION  01/27/2002    Family History  Problem Relation Age of Onset   Heart disease Mother    Diabetes Mother    Depression Mother        committed suicide at age 77   Alcohol abuse Mother    Hypertension Mother    Alcohol abuse Father        died from murder   Cancer Paternal Grandmother        colon   Diabetes Sister  CAD Sister        s/p bypass,    Obesity Sister        weighs 400 lbs   Uterine cancer Sister    Heart disease Maternal Grandfather     Social History   Socioeconomic History   Marital status: Married    Spouse name: Not on file   Number of children: Not on file   Years of education: Not on file   Highest education level: Not on file  Occupational History   Not on file  Tobacco Use   Smoking status: Never   Smokeless tobacco: Never  Vaping Use   Vaping Use: Never used  Substance and Sexual Activity   Alcohol use: Not Currently    Comment: 1 1/2 bottles of wine per weekend.   Drug use: No   Sexual activity: Yes    Partners: Male    Birth  control/protection: Surgical    Comment: ablation  Other Topics Concern   Not on file  Social History Narrative   Married   Works in Optometrist work at [employer]   3 biological children   1996- son [name]   2000- [name] son   2003-[name]      2 children "non-official foster children" teens, live with her   Enjoys watching netflix, movies, spending time with her children, going out to eat   Completed a Oceanographer in [employer]   Social Determinants of Health   Financial Resource Strain: Not on file  Food Insecurity: Not on file  Transportation Needs: Not on file  Physical Activity: Not on file  Stress: Not on file  Social Connections: Not on file  Intimate Partner Violence: Not on file    Outpatient Medications Prior to Visit  Medication Sig Dispense Refill   amLODipine (NORVASC) 10 MG tablet Take 1 tablet (10 mg total) by mouth daily. 90 tablet 1   FLUoxetine (PROZAC) 40 MG capsule Take 1 capsule (40 mg total) by mouth every morning. 90 capsule 1   metoprolol succinate (TOPROL-XL) 50 MG 24 hr tablet Take 1 tablet (50 mg total) by mouth daily. Take with or immediately following a meal. 90 tablet 1   omeprazole (PRILOSEC) 40 MG capsule Take 1 capsule (40 mg total) by mouth daily. 30 capsule 3   No facility-administered medications prior to visit.    Allergies  Allergen Reactions   Epinephrine Other (See Comments) and Palpitations    syncope   Prednisone Other (See Comments)    Psychological rxn: pt reports "losing time"   Influenza Vac Subunit Quad     Localized swelling at injection site. States she has tolerated flu mist in the past.   Nsaids     Pt stated, "I have had gastric bypass surgery and can't take"    Review of Systems  Constitutional:        (-)unexpected weight change (-)Adenopathy  HENT:  Negative for hearing loss.        (-)Rhinorrhea   Eyes:        (-)Visual disturbance  Respiratory:  Negative for cough.   Cardiovascular:  Negative for chest pain and  leg swelling.  Gastrointestinal:  Negative for blood in stool, constipation, diarrhea, nausea and vomiting.  Genitourinary:  Negative for dysuria and frequency.  Musculoskeletal:  Negative for joint pain and myalgias.  Skin:  Negative for rash.  Neurological:  Negative for headaches.  Psychiatric/Behavioral:  Negative for depression. The patient is not nervous/anxious.  Objective:    Physical Exam Constitutional:      General: She is not in acute distress.    Appearance: Normal appearance. She is not ill-appearing.  HENT:     Head: Normocephalic and atraumatic.     Right Ear: Tympanic membrane, ear canal and external ear normal.     Left Ear: Tympanic membrane, ear canal and external ear normal.  Eyes:     Extraocular Movements: Extraocular movements intact.     Pupils: Pupils are equal, round, and reactive to light.     Comments: No nystagmus  Cardiovascular:     Rate and Rhythm: Normal rate and regular rhythm.     Heart sounds: Normal heart sounds. No murmur heard.   No gallop.  Pulmonary:     Effort: Pulmonary effort is normal. No respiratory distress.     Breath sounds: Normal breath sounds. No wheezing or rales.  Abdominal:     General: There is no distension.     Palpations: Abdomen is soft.     Tenderness: There is no abdominal tenderness. There is no guarding.  Musculoskeletal:     Comments: 5/5 strength in both upper and lower extremities  Skin:    General: Skin is warm and dry.  Neurological:     Mental Status: She is alert and oriented to person, place, and time.     Deep Tendon Reflexes:     Reflex Scores:      Patellar reflexes are 2+ on the right side and 2+ on the left side. Psychiatric:        Behavior: Behavior normal.        Judgment: Judgment normal.    BP 130/76   Pulse (!) 59   Temp 98.3 F (36.8 C)   Resp 16   Ht $R'5\' 3"'FD$  (1.6 m)   Wt 205 lb 12.8 oz (93.4 kg)   SpO2 97%   BMI 36.46 kg/m  Wt Readings from Last 3 Encounters:  10/24/21  205 lb 12.8 oz (93.4 kg)  09/05/21 211 lb 6.4 oz (95.9 kg)  08/18/20 215 lb 9.6 oz (97.8 kg)       Assessment & Plan:   Problem List Items Addressed This Visit       Unprioritized   Preventative health care    Wt Readings from Last 3 Encounters:  10/24/21 205 lb 12.8 oz (93.4 kg)  09/05/21 211 lb 6.4 oz (95.9 kg)  08/18/20 215 lb 9.6 oz (97.8 kg)  Working hard on diet.  Continue along with exercise. Havrix #2 today.  Colo scheduled.  Declines flu shot. Recommended covid bivalent booster at the pharmacy. Due for mammo. Pap up to date.        Relevant Orders   Comp Met (CMET)   Lipid panel   MM 3D SCREEN BREAST BILATERAL   Hypertension   Relevant Orders   Comp Met (CMET)   Lipid panel   Other Visit Diagnoses     Need for hepatitis C screening test    -  Primary   Relevant Orders   Hepatitis C Antibody   Encounter for screening for HIV       Relevant Orders   HIV antibody (with reflex)        No orders of the defined types were placed in this encounter.   I, [name], personally preformed the services described in this documentation.  All medical record entries made by the scribe were at my direction and in my presence.  I have reviewed the chart and discharge instructions (if applicable) and agree that the record reflects my personal performance and is accurate and complete. 10/24/2021   I,[name],acting as a Education administrator for Nance Pear, NP.,have documented all relevant documentation on the behalf of Nance Pear, NP,as directed by  Nance Pear, NP while in the presence of Nance Pear, NP.   Nance Pear, NP

## 2021-10-25 ENCOUNTER — Encounter: Payer: Self-pay | Admitting: Family

## 2021-10-25 ENCOUNTER — Telehealth (HOSPITAL_BASED_OUTPATIENT_CLINIC_OR_DEPARTMENT_OTHER): Payer: Self-pay

## 2021-10-25 DIAGNOSIS — E538 Deficiency of other specified B group vitamins: Secondary | ICD-10-CM

## 2021-10-25 DIAGNOSIS — E559 Vitamin D deficiency, unspecified: Secondary | ICD-10-CM

## 2021-10-26 DIAGNOSIS — D518 Other vitamin B12 deficiency anemias: Secondary | ICD-10-CM | POA: Diagnosis not present

## 2021-10-26 DIAGNOSIS — E559 Vitamin D deficiency, unspecified: Secondary | ICD-10-CM | POA: Insufficient documentation

## 2021-10-26 DIAGNOSIS — E538 Deficiency of other specified B group vitamins: Secondary | ICD-10-CM | POA: Insufficient documentation

## 2021-10-26 MED ORDER — VITAMIN D (ERGOCALCIFEROL) 1.25 MG (50000 UNIT) PO CAPS: 50000.0000 [IU] | ORAL_CAPSULE | ORAL | 0 refills | Status: DC

## 2021-10-26 MED ORDER — CYANOCOBALAMIN 1000 MCG/ML IJ SOLN: INTRAMUSCULAR | 1 refills | Status: DC

## 2021-10-26 NOTE — Telephone Encounter (Signed)
See mychart.  

## 2021-11-03 DIAGNOSIS — D518 Other vitamin B12 deficiency anemias: Secondary | ICD-10-CM | POA: Diagnosis not present

## 2021-11-05 ENCOUNTER — Telehealth: Payer: Self-pay | Admitting: *Deleted

## 2021-11-07 DIAGNOSIS — Z1231 Encounter for screening mammogram for malignant neoplasm of breast: Secondary | ICD-10-CM | POA: Diagnosis not present

## 2021-11-07 LAB — HM MAMMOGRAPHY

## 2021-11-10 DIAGNOSIS — D518 Other vitamin B12 deficiency anemias: Secondary | ICD-10-CM | POA: Diagnosis not present

## 2021-11-10 DIAGNOSIS — Z23 Encounter for immunization: Secondary | ICD-10-CM | POA: Diagnosis not present

## 2021-11-12 NOTE — Telephone Encounter (Signed)
Patient rescheduled

## 2021-11-15 ENCOUNTER — Other Ambulatory Visit: Payer: Self-pay | Admitting: Family

## 2021-11-19 ENCOUNTER — Encounter: Payer: BC Managed Care – PPO | Admitting: Internal Medicine

## 2021-11-19 DIAGNOSIS — D518 Other vitamin B12 deficiency anemias: Secondary | ICD-10-CM | POA: Diagnosis not present

## 2021-12-25 ENCOUNTER — Other Ambulatory Visit: Payer: Self-pay | Admitting: Family

## 2021-12-25 NOTE — Telephone Encounter (Signed)
Please contact pt and let her know that she is due for follow up vit D level. Order in system. Please schedule lab visit. If normal, then we will change her to an over the counter dose of vit D.

## 2021-12-25 NOTE — Telephone Encounter (Signed)
Would you like Pt to continue Ergocalciferol?  

## 2021-12-27 ENCOUNTER — Ambulatory Visit (AMBULATORY_SURGERY_CENTER): Payer: BC Managed Care – PPO | Admitting: *Deleted

## 2021-12-27 ENCOUNTER — Other Ambulatory Visit: Payer: Self-pay

## 2021-12-27 VITALS — Ht 63.0 in | Wt 206.5 lb

## 2021-12-27 DIAGNOSIS — Z1211 Encounter for screening for malignant neoplasm of colon: Secondary | ICD-10-CM

## 2021-12-27 MED ORDER — NA SULFATE-K SULFATE-MG SULF 17.5-3.13-1.6 GM/177ML PO SOLN: 1.0000 | Freq: Once | ORAL | 0 refills | Status: AC

## 2021-12-27 NOTE — Progress Notes (Signed)

## 2021-12-27 NOTE — Telephone Encounter (Signed)
Patient advised of provider's comments and scheduled to come in for labs on 01-07-2022

## 2022-01-01 ENCOUNTER — Other Ambulatory Visit: Payer: Self-pay | Admitting: Family

## 2022-01-04 ENCOUNTER — Encounter: Payer: Self-pay | Admitting: Internal Medicine

## 2022-01-07 ENCOUNTER — Other Ambulatory Visit: Payer: BC Managed Care – PPO

## 2022-01-10 ENCOUNTER — Other Ambulatory Visit: Payer: Self-pay

## 2022-01-10 ENCOUNTER — Ambulatory Visit (AMBULATORY_SURGERY_CENTER): Payer: BC Managed Care – PPO | Admitting: Internal Medicine

## 2022-01-10 ENCOUNTER — Encounter: Payer: Self-pay | Admitting: Internal Medicine

## 2022-01-10 VITALS — BP 130/81 | HR 71 | Temp 98.1°F | Resp 23 | Ht 63.0 in | Wt 200.6 lb

## 2022-01-10 DIAGNOSIS — Z1211 Encounter for screening for malignant neoplasm of colon: Secondary | ICD-10-CM

## 2022-01-10 MED ORDER — METOPROLOL SUCCINATE ER 50 MG PO TB24: 50.0000 mg | ORAL_TABLET | Freq: Every day | ORAL | 1 refills | Status: DC

## 2022-01-10 MED ORDER — SODIUM CHLORIDE 0.9 % IV SOLN: 500.0000 mL | Freq: Once | INTRAVENOUS | Status: DC

## 2022-01-10 MED ORDER — OMEPRAZOLE 40 MG PO CPDR: DELAYED_RELEASE_CAPSULE | ORAL | 3 refills | Status: DC

## 2022-01-10 MED ORDER — FLUOXETINE HCL 40 MG PO CAPS: 40.0000 mg | ORAL_CAPSULE | Freq: Every morning | ORAL | 1 refills | Status: DC

## 2022-01-10 MED ORDER — AMLODIPINE BESYLATE 10 MG PO TABS: 10.0000 mg | ORAL_TABLET | Freq: Every day | ORAL | 1 refills | Status: DC

## 2022-01-10 NOTE — Progress Notes (Signed)
CNW - VS ° ° °Pt's states no medical or surgical changes since previsit or office visit.  °

## 2022-01-10 NOTE — Progress Notes (Signed)
GASTROENTEROLOGY PROCEDURE H&P NOTE   Primary Care Physician: Sandford Craze, NP    Reason for Procedure:  Colorectal cancer screening  Plan:    Colonoscopy  Patient is appropriate for endoscopic procedure(s) in the ambulatory ([facility]) setting.  The nature of the procedure, as well as the risks, benefits, and alternatives were carefully and thoroughly reviewed with the patient. Ample time for discussion and questions allowed. The patient understood, was satisfied, and agreed to proceed.     HPI: Sandra Nichols is a 46 y.o. female who presents for screening colonoscopy.  Medical history as below.  Tolerated the prep.  No recent chest pain or shortness of breath.  No abdominal pain today.  Past Medical History:  Diagnosis Date   Anemia    Breast cyst, left 07/25/2017   3 cysts. Resume screening in 1 yr per pt.   COVID-19 10/2019   Depression    GERD (gastroesophageal reflux disease)    Hypertension    Irritable bowel syndrome    OCD (obsessive compulsive disorder)    Panic attack     Past Surgical History:  Procedure Laterality Date   ENDOMETRIAL ABLATION     GASTRIC ROUX-EN-Y  04/20/2012   Procedure: LAPAROSCOPIC ROUX-EN-Y GASTRIC BYPASS WITH UPPER ENDOSCOPY;  Surgeon: Atilano Ina, MD,FACS;  Location: [facility];  Service: General;;   LASIK Bilateral 2015   MYOMECTOMY     TUBAL LIGATION  01/27/2002    Prior to Admission medications   Medication Sig Start Date End Date Taking? Authorizing Provider  amLODipine (NORVASC) 10 MG tablet Take 1 tablet (10 mg total) by mouth daily. 09/05/21  Yes Sandford Craze, NP  FLUoxetine (PROZAC) 40 MG capsule Take 1 capsule (40 mg total) by mouth every morning. 09/05/21  Yes Sandford Craze, NP  metoprolol succinate (TOPROL-XL) 50 MG 24 hr tablet Take 1 tablet (50 mg total) by mouth daily. Take with or immediately following a meal. 09/05/21  Yes Sandford Craze, NP  omeprazole (PRILOSEC) 40 MG capsule TAKE 1 CAPSULE(40 MG) BY  MOUTH DAILY 01/01/22  Yes Sandford Craze, NP  cyanocobalamin (,VITAMIN B-12,) 1000 MCG/ML injection IM once weekly for 4 weeks, then once monthly. 10/26/21   Sandford Craze, NP  MELATONIN PO Take by mouth. Take 6 mg daily    [provider]  Multiple Vitamin (DAILY-VITAMIN PO) Take by mouth.    [provider]  Vitamin D, Ergocalciferol, (DRISDOL) 1.25 MG (50000 UNIT) CAPS capsule Take 1 capsule (50,000 Units total) by mouth every 7 (seven) days. 10/26/21   Sandford Craze, NP    Current Outpatient Medications  Medication Sig Dispense Refill   amLODipine (NORVASC) 10 MG tablet Take 1 tablet (10 mg total) by mouth daily. 90 tablet 1   FLUoxetine (PROZAC) 40 MG capsule Take 1 capsule (40 mg total) by mouth every morning. 90 capsule 1   metoprolol succinate (TOPROL-XL) 50 MG 24 hr tablet Take 1 tablet (50 mg total) by mouth daily. Take with or immediately following a meal. 90 tablet 1   omeprazole (PRILOSEC) 40 MG capsule TAKE 1 CAPSULE(40 MG) BY MOUTH DAILY 30 capsule 3   cyanocobalamin (,VITAMIN B-12,) 1000 MCG/ML injection IM once weekly for 4 weeks, then once monthly. 16 mL 1   MELATONIN PO Take by mouth. Take 6 mg daily     Multiple Vitamin (DAILY-VITAMIN PO) Take by mouth.     Vitamin D, Ergocalciferol, (DRISDOL) 1.25 MG (50000 UNIT) CAPS capsule Take 1 capsule (50,000 Units total) by mouth  every 7 (seven) days. 12 capsule 0   Current Facility-Administered Medications  Medication Dose Route Frequency Provider Last Rate Last Admin   0.9 %  sodium chloride infusion  500 mL Intravenous Once Ashaunte Standley, Carie Caddy, MD        Allergies as of 01/10/2022 - Review Complete 01/10/2022  Allergen Reaction Noted   Epinephrine Other (See Comments) and Palpitations 04/27/2015   Prednisone Other (See Comments) 01/31/2012   Influenza vac subunit quad  10/15/2017   Nsaids  01/13/2019    Family History  Problem Relation Age of Onset   Heart disease Mother     Diabetes Mother    Depression Mother        committed suicide at age 105   Alcohol abuse Mother    Hypertension Mother    Alcohol abuse Father        died from murder   Diabetes Sister    CAD Sister        s/p bypass,    Obesity Sister        weighs 400 lbs   Uterine cancer Sister    Heart disease Maternal Grandfather    Colon cancer Paternal Grandmother    Cancer Paternal Grandmother        colon   Esophageal cancer Neg Hx    Stomach cancer Neg Hx    Rectal cancer Neg Hx     Social History   Socioeconomic History   Marital status: Married    Spouse name: Not on file   Number of children: Not on file   Years of education: Not on file   Highest education level: Not on file  Occupational History   Not on file  Tobacco Use   Smoking status: Never   Smokeless tobacco: Never  Vaping Use   Vaping Use: Never used  Substance and Sexual Activity   Alcohol use: Not Currently    Comment: 1 1/2 bottles of wine per weekend.-stopped 12/27/21   Drug use: No   Sexual activity: Yes    Partners: Male    Birth control/protection: Surgical    Comment: ablation  Other Topics Concern   Not on file  Social History Narrative   Married   Works in Counselling psychologist work at [employer]   3 biological children   1996- son Ree Kida   2000- [name] son   2003-[name]      2 children "non-official foster children" teens, live with her   Enjoys watching netflix, movies, spending time with her children, going out to eat   Completed a Scientist, water quality in Museum/gallery curator   Social Determinants of Health   Financial Resource Strain: Not on file  Food Insecurity: Not on file  Transportation Needs: Not on file  Physical Activity: Not on file  Stress: Not on file  Social Connections: Not on file  Intimate Partner Violence: Not on file    Physical Exam: Vital signs in last 24 hours: @BP  (!) 157/88    Pulse 76    Temp 98.1 F (36.7 C)    Ht 5\' 3"  (1.6 m)    Wt 200 lb 9.6 oz (91 kg)    SpO2 98%    BMI 35.53 kg/m  GEN:  NAD EYE: Sclerae anicteric ENT: MMM CV: Non-tachycardic Pulm: CTA b/l GI: Soft, NT/ND NEURO:  Alert & Oriented x 3   , MD [facility]  01/10/2022 9:16 AM

## 2022-01-10 NOTE — Progress Notes (Signed)
Report given to PACU, vss 

## 2022-01-10 NOTE — Patient Instructions (Signed)
YOU HAD AN ENDOSCOPIC PROCEDURE TODAY AT THE [facility]:   Refer to the procedure report that was given to you for any specific questions about what was found during the examination.  If the procedure report does not answer your questions, please call your gastroenterologist to clarify.  If you requested that your care partner not be given the details of your procedure findings, then the procedure report has been included in a sealed envelope for you to review at your convenience later.  **Handouts given on diverticulosis and hemorrhoids**  YOU SHOULD EXPECT: Some feelings of bloating in the abdomen. Passage of more gas than usual.  Walking can help get rid of the air that was put into your GI tract during the procedure and reduce the bloating. If you had a lower endoscopy (such as a colonoscopy or flexible sigmoidoscopy) you may notice spotting of blood in your stool or on the toilet paper. If you underwent a bowel prep for your procedure, you may not have a normal bowel movement for a few days.  Please Note:  You might notice some irritation and congestion in your nose or some drainage.  This is from the oxygen used during your procedure.  There is no need for concern and it should clear up in a day or so.  SYMPTOMS TO REPORT IMMEDIATELY:  Following lower endoscopy (colonoscopy or flexible sigmoidoscopy):  Excessive amounts of blood in the stool  Significant tenderness or worsening of abdominal pains  Swelling of the abdomen that is new, acute  Fever of 100F or higher  For urgent or emergent issues, a gastroenterologist can be reached at any hour by calling [phone]. Do not use MyChart messaging for urgent concerns.    DIET:  We do recommend a small meal at first, but then you may proceed to your regular diet.  Drink plenty of fluids but you should avoid alcoholic beverages for 24 hours.  ACTIVITY:  You should plan to take it easy for the rest of today and you should NOT  DRIVE or use heavy machinery until tomorrow (because of the sedation medicines used during the test).    FOLLOW UP: Our staff will call the number listed on your records 48-72 hours following your procedure to check on you and address any questions or concerns that you may have regarding the information given to you following your procedure. If we do not reach you, we will leave a message.  We will attempt to reach you two times.  During this call, we will ask if you have developed any symptoms of COVID 19. If you develop any symptoms (ie: fever, flu-like symptoms, shortness of breath, cough etc.) before then, please call [phone].  If you test positive for Covid 19 in the 2 weeks post procedure, please call and report this information to us.    If any biopsies were taken you will be contacted by phone or by letter within the next 1-3 weeks.  Please call us at [phone] if you have not heard about the biopsies in 3 weeks.    SIGNATURES/CONFIDENTIALITY: You and/or your care partner have signed paperwork which will be entered into your electronic medical record.  These signatures attest to the fact that that the information above on your After Visit Summary has been reviewed and is understood.  Full responsibility of the confidentiality of this discharge information lies with you and/or your care-partner.  

## 2022-01-10 NOTE — Op Note (Signed)
[facility] Patient Name: Sandra Nichols Procedure Date: 01/10/2022 9:20 AM MRN: [id number] Endoscopist: Beverley Fiedler , MD Age: 46 Referring MD:  Date of Birth: December 01, 1976 Gender: Female Account #: [id number] Procedure:                Colonoscopy Indications:              Screening for colorectal malignant neoplasm, This                            is the patient's first colonoscopy Medicines:                Monitored Anesthesia Care Procedure:                Pre-Anesthesia Assessment:                           - Prior to the procedure, a History and Physical                            was performed, and patient medications and                            allergies were reviewed. The patient's tolerance of                            previous anesthesia was also reviewed. The risks                            and benefits of the procedure and the sedation                            options and risks were discussed with the patient.                            All questions were answered, and informed consent                            was obtained. Prior Anticoagulants: The patient has                            taken no previous anticoagulant or antiplatelet                            agents. ASA Grade Assessment: II - A patient with                            mild systemic disease. After reviewing the risks                            and benefits, the patient was deemed in                            satisfactory condition to undergo the procedure.  After obtaining informed consent, the colonoscope                            was passed under direct vision. Throughout the                            procedure, the patient's blood pressure, pulse, and                            oxygen saturations were monitored continuously. The                            Olympus [id number]-[id number] Colonoscope was                            introduced through the anus and  advanced to the                            cecum, identified by appendiceal orifice and                            ileocecal valve. The colonoscopy was performed                            without difficulty. The patient tolerated the                            procedure well. The quality of the bowel                            preparation was good. The ileocecal valve,                            appendiceal orifice, and rectum were photographed. Scope In: 9:27:21 AM Scope Out: 9:42:41 AM Scope Withdrawal Time: 0 hours 12 minutes 5 seconds  Total Procedure Duration: 0 hours 15 minutes 20 seconds  Findings:                 The digital rectal exam was normal.                           Multiple small and large-mouthed diverticula were                            found from transverse colon to sigmoid colon.                           Internal hemorrhoids were found during                            retroflexion. The hemorrhoids were small.                           The exam was otherwise without abnormality. Complications:            No immediate  complications. Estimated Blood Loss:     Estimated blood loss: none. Impression:               - Moderate diverticulosis from transverse colon to                            sigmoid colon.                           - Internal hemorrhoids.                           - The examination was otherwise normal.                           - No specimens collected. Recommendation:           - Patient has a contact number available for                            emergencies. The signs and symptoms of potential                            delayed complications were discussed with the                            patient. Return to normal activities tomorrow.                            Written discharge instructions were provided to the                            patient.                           - Resume previous diet.                           - Continue present  medications.                           - Repeat colonoscopy in 10 years for screening                            purposes. Beverley Fiedler, MD 01/10/2022 9:45:40 AM This report has been signed electronically.

## 2022-01-12 DIAGNOSIS — D518 Other vitamin B12 deficiency anemias: Secondary | ICD-10-CM | POA: Diagnosis not present

## 2022-01-14 ENCOUNTER — Other Ambulatory Visit (INDEPENDENT_AMBULATORY_CARE_PROVIDER_SITE_OTHER): Payer: BC Managed Care – PPO

## 2022-01-14 ENCOUNTER — Telehealth: Payer: Self-pay

## 2022-01-14 DIAGNOSIS — E538 Deficiency of other specified B group vitamins: Secondary | ICD-10-CM | POA: Diagnosis not present

## 2022-01-14 DIAGNOSIS — E559 Vitamin D deficiency, unspecified: Secondary | ICD-10-CM

## 2022-01-14 NOTE — Telephone Encounter (Signed)
°  Follow up Call-  Call back number 01/10/2022  Post procedure Call Back phone  # [phone] cell  Permission to leave phone message Yes  Some recent data might be hidden     Patient questions:  Do you have a fever, pain , or abdominal swelling? No. Pain Score  0 *  Have you tolerated food without any problems? Yes.    Have you been able to return to your normal activities? Yes.    Do you have any questions about your discharge instructions: Diet   No. Medications  No. Follow up visit  No.  Do you have questions or concerns about your Care? No.  Actions: * If pain score is 4 or above: No action needed, pain <4.

## 2022-01-15 ENCOUNTER — Other Ambulatory Visit: Payer: Self-pay | Admitting: Family

## 2022-01-15 LAB — B12 AND FOLATE PANEL
Folate: 9.9 ng/mL (ref >3.0)
Vitamin B-12: 1371 pg/mL — ABNORMAL HIGH (ref 232–1245)

## 2022-01-15 LAB — VITAMIN D 25 HYDROXY (VIT D DEFICIENCY, FRACTURES): Vit D, 25-Hydroxy: 47.1 ng/mL (ref 30.0–100.0)

## 2022-01-15 MED ORDER — VITAMIN D3 75 MCG (3000 UT) PO TABS: 1.0000 | ORAL_TABLET | Freq: Every day | ORAL | Status: AC

## 2022-01-23 DIAGNOSIS — J329 Chronic sinusitis, unspecified: Secondary | ICD-10-CM | POA: Diagnosis not present

## 2022-01-23 DIAGNOSIS — Z20822 Contact with and (suspected) exposure to covid-19: Secondary | ICD-10-CM | POA: Diagnosis not present

## 2022-01-23 DIAGNOSIS — R509 Fever, unspecified: Secondary | ICD-10-CM | POA: Diagnosis not present

## 2022-02-18 ENCOUNTER — Emergency Department (HOSPITAL_BASED_OUTPATIENT_CLINIC_OR_DEPARTMENT_OTHER): Payer: BC Managed Care – PPO

## 2022-02-18 ENCOUNTER — Emergency Department (HOSPITAL_BASED_OUTPATIENT_CLINIC_OR_DEPARTMENT_OTHER)
Admission: EM | Admit: 2022-02-18 | Discharge: 2022-02-18 | Disposition: A | Discharge status: Home / Self Care | Payer: BC Managed Care – PPO | Attending: Emergency Medicine | Admitting: Emergency Medicine

## 2022-02-18 ENCOUNTER — Other Ambulatory Visit: Payer: Self-pay

## 2022-02-18 ENCOUNTER — Encounter (HOSPITAL_BASED_OUTPATIENT_CLINIC_OR_DEPARTMENT_OTHER): Payer: Self-pay

## 2022-02-18 DIAGNOSIS — S5002XA Contusion of left elbow, initial encounter: Secondary | ICD-10-CM | POA: Diagnosis not present

## 2022-02-18 DIAGNOSIS — W108XXA Fall (on) (from) other stairs and steps, initial encounter: Secondary | ICD-10-CM | POA: Diagnosis not present

## 2022-02-18 DIAGNOSIS — S59902A Unspecified injury of left elbow, initial encounter: Secondary | ICD-10-CM | POA: Diagnosis not present

## 2022-02-18 DIAGNOSIS — M25522 Pain in left elbow: Secondary | ICD-10-CM | POA: Diagnosis not present

## 2022-02-18 DIAGNOSIS — Z79899 Other long term (current) drug therapy: Secondary | ICD-10-CM | POA: Insufficient documentation

## 2022-02-18 NOTE — Discharge Instructions (Signed)
Apply Voltaren gel topically to left elbow for NSAID pain relief.  Can also apply ice for 20 minutes at a time.  Limited use of sling for support, remove arm from sling.  [place] throughout the day for gentle shoulder range of motion exercises and range of motion of left elbow as tolerated.  Follow-up with orthopedics in 1 week if not improving, referral given.

## 2022-02-18 NOTE — ED Notes (Signed)
PT verbalized understanding of all instructions and had no further questions at this time

## 2022-02-18 NOTE — ED Triage Notes (Signed)
Pt sates she tripped/fell down steps last night-pain to mid back and left elbow-denies head/neck pain-no LOC-NAD-steady gait

## 2022-02-18 NOTE — ED Provider Notes (Signed)
[facility] EMERGENCY DEPARTMENT Provider Note   CSN: [id number] Arrival date & time: 02/18/22  1238     History  Chief Complaint  Patient presents with   Fall    Sandra Nichols is a 45 y.o. female.  46 year old female with complaint of left elbow pain after mechanical fall downstairs which occurred last night.  Patient states that her steps are oddly spaced, was headed down the steps when she over stepped and fell down approximately 8 steps.  Denies loss of consciousness, is not anticoagulated.  Reports pain primarily in her left elbow with a little bit of back discomfort.  She has been ambulatory since the fall without difficulty.  No other injuries, complaints, concerns.      Home Medications Prior to Admission medications   Medication Sig Start Date End Date Taking? Authorizing Provider  amLODipine (NORVASC) 10 MG tablet Take 1 tablet (10 mg total) by mouth daily. 01/10/22   Sandford Craze, NP  Cholecalciferol (VITAMIN D3) 75 MCG (3000 UT) TABS Take 1 tablet by mouth daily. 01/15/22   Sandford Craze, NP  cyanocobalamin (,VITAMIN B-12,) 1000 MCG/ML injection IM once weekly for 4 weeks, then once monthly. 10/26/21   Sandford Craze, NP  FLUoxetine (PROZAC) 40 MG capsule Take 1 capsule (40 mg total) by mouth every morning. 01/10/22   Sandford Craze, NP  MELATONIN PO Take by mouth. Take 6 mg daily    [provider]  metoprolol succinate (TOPROL-XL) 50 MG 24 hr tablet Take 1 tablet (50 mg total) by mouth daily. Take with or immediately following a meal. 01/10/22   Sandford Craze, NP  Multiple Vitamin (DAILY-VITAMIN PO) Take by mouth.    [provider]  omeprazole (PRILOSEC) 40 MG capsule TAKE 1 CAPSULE(40 MG) BY MOUTH DAILY 01/10/22   Sandford Craze, NP      Allergies    Epinephrine, Prednisone, Influenza vac subunit quad, and Nsaids    Review of Systems   Review of Systems Negative except as per HPI Physical  Exam Updated Vital Signs BP (!) 175/115 (BP Location: Right Arm)    Pulse 82    Temp 98.9 F (37.2 C) (Oral)    Resp 18    Ht 5\' 3"  (1.6 m)    Wt 92.5 kg    SpO2 100%    BMI 36.14 kg/m  Physical Exam Vitals and nursing note reviewed.  Constitutional:      General: She is not in acute distress.    Appearance: She is well-developed. She is not diaphoretic.  HENT:     Head: Normocephalic and atraumatic.  Pulmonary:     Effort: Pulmonary effort is normal.  Musculoskeletal:        General: Tenderness present. No swelling or deformity.     Right elbow: Normal range of motion. No tenderness.     Left elbow: Decreased range of motion. Tenderness present.     Cervical back: Normal range of motion and neck supple. No tenderness.     Thoracic back: No tenderness or bony tenderness.     Lumbar back: No tenderness or bony tenderness.     Comments: Tenderness to left elbow medial and lateral epicondyles, unable to fully flex or extend the elbow secondary to pain. Mild tenderness to her left trapezius.  Skin:    General: Skin is warm and dry.     Findings: No erythema or rash.  Neurological:     Mental Status: She is alert and oriented to person, place, and  time.     Sensory: No sensory deficit.     Motor: No weakness.  Psychiatric:        Behavior: Behavior normal.    ED Results / Procedures / Treatments   Labs (all labs ordered are listed, but only abnormal results are displayed) Labs Reviewed - No data to display  EKG None  Radiology DG Elbow Complete Left  Result Date: 02/18/2022 CLINICAL DATA:  Diffuse elbow pain after falling down stairs last night. Limited range of motion. EXAM: LEFT ELBOW - COMPLETE 3+ VIEW COMPARISON:  None. FINDINGS: The mineralization and alignment are normal. There is no evidence of acute fracture or dislocation. The joint spaces are preserved. No evidence of elbow joint effusion or focal soft tissue swelling. There is prominent olecranon spurring.  IMPRESSION: No evidence of acute fracture, dislocation or elbow joint effusion. Electronically Signed   By: Carey Bullocks M.D.   On: 02/18/2022 13:36    Procedures Procedures    Medications Ordered in ED Medications - No data to display  ED Course/ Medical Decision Making/ A&P                           Medical Decision Making Amount and/or Complexity of Data Reviewed Radiology: ordered.   46 year old female presents for evaluation after fall down approximately 8 steps which happened last night resulting in pain in her elbow.  Also reports some pain in her back.  She does not have any midline or bony tenderness to the back, is ambulatory with a steady gait.  Her left elbow does have limited range of motion in full flexion and full extension secondary to pain.  She is tender at the medial lateral epicondyles, no tenderness along olecranon, strong radial pulse present, sensation intact, skin intact.  History of gastric bypass, will avoid oral NSAIDs and recommend topical diclofenac.  Given sling for rest with recommendation to remove arm from sling periodically for range of motion of the shoulder and range of motion of the elbow as tolerated with referral to orthopedics for recheck in 1 week if pain continues. History reviewed and is negative for fracture or acute bony injury.        Final Clinical Impression(s) / ED Diagnoses Final diagnoses:  Fall down stairs, initial encounter  Contusion of left elbow, initial encounter    Rx / DC Orders ED Discharge Orders     None         Jeannie Fend, PA-C 02/18/22 1425    Derwood Kaplan, MD 02/19/22 [phone]

## 2022-02-18 NOTE — ED Notes (Signed)
Pt seen and treateds before nurse could get in room , pt states she fell down stairs and landed on her ebow

## 2022-03-01 ENCOUNTER — Ambulatory Visit: Payer: Self-pay

## 2022-03-01 ENCOUNTER — Encounter: Payer: Self-pay | Admitting: Family Medicine

## 2022-03-01 ENCOUNTER — Ambulatory Visit (INDEPENDENT_AMBULATORY_CARE_PROVIDER_SITE_OTHER): Payer: BC Managed Care – PPO | Admitting: Family Medicine

## 2022-03-01 VITALS — BP 110/84 | Ht 63.0 in | Wt 204.0 lb

## 2022-03-01 DIAGNOSIS — M6788 Other specified disorders of synovium and tendon, other site: Secondary | ICD-10-CM

## 2022-03-01 DIAGNOSIS — M7660 Achilles tendinitis, unspecified leg: Secondary | ICD-10-CM

## 2022-03-01 MED ORDER — NITROGLYCERIN 0.2 MG/HR TD PT24: MEDICATED_PATCH | TRANSDERMAL | 11 refills | Status: AC

## 2022-03-01 NOTE — Patient Instructions (Signed)
Nice to meet you ?Please try the heel lifts  ?Please try the nitro patches  ?Please set up to start the shockwave therapy   ?Please send me a message in MyChart with any questions or updates.  ?Please see me back in 4 weeks.  ? ?--Dr. Jordan Likes ? ?

## 2022-03-01 NOTE — Progress Notes (Signed)
?  Sandra Nichols - 46 y.o. female MRN [id number]  Date of birth: [birth year] ? ?SUBJECTIVE:  Including CC & ROS.  ?No chief complaint on file. ? ? ?Rabecka Gear is a 46 y.o. female that is presenting with acute on chronic left Achilles pain.  The pain is posterior nature.  She denies any specific injury.  It is worse with going up and down stairs.  No history of surgery. ? ?Review of the note from 2/27 shows he was counseled on over-the-counter medications and supportive care. ?Independent review of the left elbow x-ray from 2/27 shows a mild spur on the olecranon. ? ?Review of Systems ?See HPI  ? ?HISTORY: Past Medical, Surgical, Social, and Family History Reviewed & Updated per EMR.   ?Pertinent Historical Findings include: ? ?Past Medical History:  ?Diagnosis Date  ? Anemia   ? Breast cyst, left 07/25/2017  ? 3 cysts. Resume screening in 1 yr per pt.  ? COVID-19 10/2019  ? Depression   ? GERD (gastroesophageal reflux disease)   ? Hypertension   ? Irritable bowel syndrome   ? OCD (obsessive compulsive disorder)   ? Panic attack   ? ? ?Past Surgical History:  ?Procedure Laterality Date  ? ENDOMETRIAL ABLATION    ? GASTRIC ROUX-EN-Y  04/20/2012  ? Procedure: LAPAROSCOPIC ROUX-EN-Y GASTRIC BYPASS WITH UPPER ENDOSCOPY;  Surgeon: Atilano Ina, MD,FACS;  Location: [facility];  Service: General;;  ? LASIK Bilateral 2015  ? MYOMECTOMY    ? TUBAL LIGATION  01/27/2002  ? ? ? ?PHYSICAL EXAM:  ?VS: BP 110/84 (BP Location: Right Arm, Patient Position: Sitting)   Ht 5\' 3"  (1.6 m)   Wt 204 lb (92.5 kg)   BMI 36.14 kg/m?  ?Physical Exam ?Gen: NAD, alert, cooperative with exam, well-appearing ?MSK:  ?Neurovascularly intact   ? ?Limited ultrasound: Left Achilles: ? ?Neovascularization and mucoid changes at the insertional component of the Achilles tendon. ?There is calcific changes and a heel spur within the Achilles. ?Retrocalcaneal bursitis evident. ? ?Summary: Achilles tendinosis ? ?Ultrasound and interpretation by ,  MD ? ? ? ?ASSESSMENT & PLAN:  ? ?Achilles tendinosis of left lower extremity ?Acute on chronic in nature.  Has changes appreciated at the Achilles insertion. ?-Counseled on home exercise therapy and supportive care. ?-Heel lifts. ?-Nitro patches. ?-Pursue shockwave therapy. ?-Could consider injection. ? ? ? ? ?

## 2022-03-01 NOTE — Assessment & Plan Note (Signed)
Acute on chronic in nature.  Has changes appreciated at the Achilles insertion. ?-Counseled on home exercise therapy and supportive care. ?-Heel lifts. ?-Nitro patches. ?-Pursue shockwave therapy. ?-Could consider injection. ?

## 2022-03-02 ENCOUNTER — Other Ambulatory Visit: Payer: Self-pay | Admitting: Family

## 2022-03-07 ENCOUNTER — Encounter: Payer: Self-pay | Admitting: Family Medicine

## 2022-03-07 ENCOUNTER — Ambulatory Visit (INDEPENDENT_AMBULATORY_CARE_PROVIDER_SITE_OTHER): Payer: Self-pay | Admitting: Family Medicine

## 2022-03-07 NOTE — Assessment & Plan Note (Signed)
Completed shockwave therapy  

## 2022-03-07 NOTE — Progress Notes (Signed)
?  Sandra Nichols - 46 y.o. female MRN [id number]  Date of birth: October 04, 1976 ? ?SUBJECTIVE:  Including CC & ROS.  ?No chief complaint on file. ? ? ?Sandra Nichols is a 46 y.o. female that is  here for shockwave therapy. ? ? ? ?Review of Systems ?See HPI  ? ?HISTORY: Past Medical, Surgical, Social, and Family History Reviewed & Updated per EMR.   ?Pertinent Historical Findings include: ? ?Past Medical History:  ?Diagnosis Date  ? Anemia   ? Breast cyst, left 07/25/2017  ? 3 cysts. Resume screening in 1 yr per pt.  ? COVID-19 10/2019  ? Depression   ? GERD (gastroesophageal reflux disease)   ? Hypertension   ? Irritable bowel syndrome   ? OCD (obsessive compulsive disorder)   ? Panic attack   ? ? ?Past Surgical History:  ?Procedure Laterality Date  ? ENDOMETRIAL ABLATION    ? GASTRIC ROUX-EN-Y  04/20/2012  ? Procedure: LAPAROSCOPIC ROUX-EN-Y GASTRIC BYPASS WITH UPPER ENDOSCOPY;  Surgeon: Atilano Ina, MD,FACS;  Location: [facility];  Service: General;;  ? LASIK Bilateral 2015  ? MYOMECTOMY    ? TUBAL LIGATION  01/27/2002  ? ? ? ?PHYSICAL EXAM:  ?VS: Ht 5\' 3"  (1.6 m)   Wt 204 lb (92.5 kg)   BMI 36.14 kg/m?  ?Physical Exam ?Gen: NAD, alert, cooperative with exam, well-appearing ?MSK:  ?Neurovascularly intact   ? ?ECSWT Note ?Sandra Nichols ?1976/05/27 ? ?Procedure: ECSWT ?Indications: left heel pain  ? ?Procedure Details ?Consent: Risks of procedure as well as the alternatives and risks of each were explained to the (patient/caregiver).  Consent for procedure obtained. ?Time Out: Verified patient identification, verified procedure, site/side was marked, verified correct patient position, special equipment/implants available, medications/allergies/relevent history reviewed, required imaging and test results available.  Performed.  The area was cleaned with iodine and alcohol swabs.   ? ?The left achilles was targeted for Extracorporeal shockwave therapy.  ? ?Preset: achillodynia ?Power Level: 40 ?Frequency: 10 ?Impulse/cycles: 2400 ?Head size:  medium  ?Session: 1st ? ?Patient did tolerate procedure well. ? ? ? ?ASSESSMENT & PLAN:  ? ?Achilles tendinosis of left lower extremity ?Completed shockwave therapy ? ? ? ? ?

## 2022-03-11 ENCOUNTER — Other Ambulatory Visit: Payer: Self-pay | Admitting: Family

## 2022-03-15 ENCOUNTER — Ambulatory Visit: Payer: BC Managed Care – PPO | Admitting: Family Medicine

## 2022-04-23 ENCOUNTER — Ambulatory Visit: Payer: BC Managed Care – PPO | Admitting: Family

## 2022-04-23 ENCOUNTER — Telehealth: Payer: Self-pay | Admitting: Family

## 2022-04-23 VITALS — BP 132/89 | HR 58 | Temp 98.0°F | Resp 16 | Ht 63.0 in | Wt 205.0 lb

## 2022-04-23 DIAGNOSIS — E538 Deficiency of other specified B group vitamins: Secondary | ICD-10-CM | POA: Diagnosis not present

## 2022-04-23 DIAGNOSIS — E559 Vitamin D deficiency, unspecified: Secondary | ICD-10-CM | POA: Diagnosis not present

## 2022-04-23 DIAGNOSIS — I10 Essential (primary) hypertension: Secondary | ICD-10-CM | POA: Diagnosis not present

## 2022-04-23 DIAGNOSIS — F418 Other specified anxiety disorders: Secondary | ICD-10-CM

## 2022-04-23 DIAGNOSIS — K219 Gastro-esophageal reflux disease without esophagitis: Secondary | ICD-10-CM

## 2022-04-23 DIAGNOSIS — N951 Menopausal and female climacteric states: Secondary | ICD-10-CM

## 2022-04-23 DIAGNOSIS — F429 Obsessive-compulsive disorder, unspecified: Secondary | ICD-10-CM

## 2022-04-23 LAB — BASIC METABOLIC PANEL
BUN: 17 mg/dL (ref 6–23)
CO2: 28 mEq/L (ref 19–32)
Calcium: 9.3 mg/dL (ref 8.4–10.5)
Chloride: 103 mEq/L (ref 96–112)
Creatinine, Ser: 0.7 mg/dL (ref 0.40–1.20)
GFR: 102.42 mL/min (ref >60.00)
Glucose, Bld: 86 mg/dL (ref 70–99)
Potassium: 3.9 mEq/L (ref 3.5–5.1)
Sodium: 139 mEq/L (ref 135–145)

## 2022-04-23 LAB — VITAMIN B12: Vitamin B-12: 323 pg/mL (ref 211–911)

## 2022-04-23 LAB — VITAMIN D 25 HYDROXY (VIT D DEFICIENCY, FRACTURES): VITD: 30.52 ng/mL (ref 30.00–100.00)

## 2022-04-23 MED ORDER — OMEPRAZOLE 40 MG PO CPDR: 40.0000 mg | DELAYED_RELEASE_CAPSULE | Freq: Every day | ORAL | 3 refills | Status: AC | PRN

## 2022-04-23 NOTE — Patient Instructions (Signed)
Please complete lab work prior to leaving.   

## 2022-04-23 NOTE — Assessment & Plan Note (Signed)
BP Readings from Last 3 Encounters:  ?04/23/22 132/89  ?03/01/22 110/84  ?02/18/22 (!) 175/115  ? ?Stable on amlodipine and metoprolol.  ?

## 2022-04-23 NOTE — Assessment & Plan Note (Signed)
Stable on prozac, continue same. 

## 2022-04-23 NOTE — Assessment & Plan Note (Signed)
Stable. Recommended that she change omeprazole to PRN and focus on dietary choices.  ?

## 2022-04-23 NOTE — Telephone Encounter (Signed)
See mychart.  

## 2022-04-23 NOTE — Assessment & Plan Note (Signed)
Stable on prozac. Continue same.  °

## 2022-04-23 NOTE — Progress Notes (Signed)
? ?Subjective:  ? ?By signing my name below, I, Cassell Clement, attest that this documentation has been prepared under the direction and in the presence of Sandford Craze NP, 04/23/2022   ? ? Patient ID: Sandra Nichols, female    DOB: [birth year], 46 y.o.   MRN: [id number] ? ?Chief Complaint  ?Patient presents with  ? Hypertension  ?  Here for follow up  ? Depression  ?  Follow up depression with anxiety, "doing well on medication"  ? ? ?HPI ?Patient is in today for an office visit  ? ?Perimenopause/Bone Density - She inquires about the effects of perimenopause. Her relative had a drastic change in bone density during her menopause and therefore, the patient wants to be on top of her bone density levels.  ? ?OCD - For the most part, her OCD is stable. She occasionally has flare-ups but they usually occurs during stressful situations. It is also occasionally triggered when she is up during the late nights.  ? ?Prozac -She is doing well on 40 MG of Prozac  ? ?Vitamin D3 - She is currently taking D3 supplements ? ?Blood Pressure - As of today's visit, her blood pressure is good. She is taking 10 MG of Amlodipine and 50 MG of Metoprolol Succinate. She states that the blood pressure cuff gives her mild anxiety.  ?BP Readings from Last 3 Encounters:  ?04/23/22 132/89  ?03/01/22 110/84  ?02/18/22 (!) 175/115  ? ?COVID - 19 - She has not received the COVID 19 bivalent vaccine.  ? ?Heartburn - She takes 40 MG of Omeprazole daily. When her diet is well, she doesn't feel the effects of heartburn.  ? ?Health Maintenance Due  ?Topic Date Due  ? COVID-19 Vaccine (4 - Booster for Pfizer series) 01/27/2021  ? ? ?Past Medical History:  ?Diagnosis Date  ? Anemia   ? Breast cyst, left 07/25/2017  ? 3 cysts. Resume screening in 1 yr per pt.  ? COVID-19 10/2019  ? Depression   ? GERD (gastroesophageal reflux disease)   ? Hypertension   ? Irritable bowel syndrome   ? OCD (obsessive compulsive disorder)   ? Panic attack   ? ? ?Past Surgical  History:  ?Procedure Laterality Date  ? ENDOMETRIAL ABLATION    ? GASTRIC ROUX-EN-Y  04/20/2012  ? Procedure: LAPAROSCOPIC ROUX-EN-Y GASTRIC BYPASS WITH UPPER ENDOSCOPY;  Surgeon: Atilano Ina, MD,FACS;  Location: [facility];  Service: General;;  ? LASIK Bilateral 2015  ? MYOMECTOMY    ? TUBAL LIGATION  01/27/2002  ? ? ?Family History  ?Problem Relation Age of Onset  ? Heart disease Mother   ? Diabetes Mother   ? Depression Mother   ?     committed suicide at age 56  ? Alcohol abuse Mother   ? Hypertension Mother   ? Alcohol abuse Father   ?     died from murder  ? Diabetes Sister   ? CAD Sister   ?     s/p bypass,   ? Obesity Sister   ?     weighs 400 lbs  ? Uterine cancer Sister   ? Heart disease Maternal Grandfather   ? Colon cancer Paternal Grandmother   ? Cancer Paternal Grandmother   ?     colon  ? Esophageal cancer Neg Hx   ? Stomach cancer Neg Hx   ? Rectal cancer Neg Hx   ? ? ?Social History  ? ?Socioeconomic History  ? Marital status: Married  ?  Spouse name: Not on file  ? Number of children: Not on file  ? Years of education: Not on file  ? Highest education level: Not on file  ?Occupational History  ? Not on file  ?Tobacco Use  ? Smoking status: Never  ? Smokeless tobacco: Never  ?Vaping Use  ? Vaping Use: Never used  ?Substance and Sexual Activity  ? Alcohol use: Not Currently  ?  Comment: 1 1/2 bottles of wine per weekend.-stopped 12/27/21  ? Drug use: No  ? Sexual activity: Yes  ?  Partners: Male  ?  Birth control/protection: Surgical  ?  Comment: ablation  ?Other Topics Concern  ? Not on file  ?Social History Narrative  ? Married  ? Works in Counselling psychologistT desk work at [employer]  ? 3 biological children  ? [id number]- son Ree KidaJack  ? 2000- [name] son  ? 2003-[name]  ?   ? 2 children "non-official foster children" teens, live with her  ? Enjoys watching netflix, movies, spending time with her children, going out to eat  ? 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  ? ?Social Determinants of Health  ? ?Financial Resource Strain: Not on  file  ?Food Insecurity: Not on file  ?Transportation Needs: Not on file  ?Physical Activity: Not on file  ?Stress: Not on file  ?Social Connections: Not on file  ?Intimate Partner Violence: Not on file  ? ? ?Outpatient Medications Prior to Visit  ?Medication Sig Dispense Refill  ? amLODipine (NORVASC) 10 MG tablet TAKE 1 TABLET(10 MG) BY MOUTH DAILY 90 tablet 1  ? Cholecalciferol (VITAMIN D3) 75 MCG (3000 UT) TABS Take 1 tablet by mouth daily. 30 tablet   ? cyanocobalamin (,VITAMIN B-12,) 1000 MCG/ML injection 1000mcg IM once weekly for 4 weeks, then once monthly. 16 mL 1  ? FLUoxetine (PROZAC) 40 MG capsule TAKE 1 CAPSULE(40 MG) BY MOUTH EVERY MORNING 90 capsule 1  ? MELATONIN PO Take by mouth. Take 6 mg daily    ? metoprolol succinate (TOPROL-XL) 50 MG 24 hr tablet TAKE 1 TABLET(50 MG) BY MOUTH DAILY WITH OR IMMEDIATELY FOLLOWING A MEAL 90 tablet 1  ? Multiple Vitamin (DAILY-VITAMIN PO) Take by mouth.    ? nitroGLYCERIN (NITRODUR - DOSED IN MG/24 HR) 0.2 mg/hr patch Cut and apply 1/4 patch to most painful area q24h. 30 patch 11  ? omeprazole (PRILOSEC) 40 MG capsule TAKE 1 CAPSULE(40 MG) BY MOUTH DAILY 30 capsule 3  ? ?No facility-administered medications prior to visit.  ? ? ?Allergies  ?Allergen Reactions  ? Epinephrine Other (See Comments) and Palpitations  ?  syncope  ? Prednisone Other (See Comments)  ?  Psychological rxn: pt reports "losing time"  ? Influenza Vac Subunit Quad   ?  Localized swelling at injection site. States she has tolerated flu mist in the past.  ? Nsaids   ?  Pt stated, "I have had gastric bypass surgery and can't take"  ? ? ?ROS ?See HPI ?   ?Objective:  ?  ?Physical Exam ?Constitutional:   ?   General: She is not in acute distress. ?   Appearance: Normal appearance. She is not ill-appearing.  ?HENT:  ?   Head: Normocephalic and atraumatic.  ?   Right Ear: External ear normal.  ?   Left Ear: External ear normal.  ?Eyes:  ?   Extraocular Movements: Extraocular movements intact.  ?    Pupils: Pupils are equal, round, and reactive to light.  ?Neck:  ?  Thyroid: No thyromegaly.  ?Cardiovascular:  ?   Rate and Rhythm: Normal rate and regular rhythm.  ?   Heart sounds: Normal heart sounds. No murmur heard. ?  No gallop.  ?Pulmonary:  ?   Effort: Pulmonary effort is normal. No respiratory distress.  ?   Breath sounds: Normal breath sounds. No wheezing or rales.  ?Lymphadenopathy:  ?   Cervical: No cervical adenopathy.  ?Skin: ?   General: Skin is warm and dry.  ?Neurological:  ?   Mental Status: She is alert and oriented to person, place, and time.  ?Psychiatric:     ?   Mood and Affect: Mood normal.     ?   Behavior: Behavior normal.     ?   Judgment: Judgment normal.  ? ? ?BP 132/89 (BP Location: Right Arm, Patient Position: Sitting, Cuff Size: Large)   Pulse (!) 58   Temp 98 ?F (36.7 ?C) (Oral)   Resp 16   Ht 5\' 3"  (1.6 m)   Wt 205 lb (93 kg)   SpO2 99%   BMI 36.31 kg/m?  ?Wt Readings from Last 3 Encounters:  ?04/23/22 205 lb (93 kg)  ?03/07/22 204 lb (92.5 kg)  ?03/01/22 204 lb (92.5 kg)  ? ? ?   ?Assessment & Plan:  ? ?Problem List Items Addressed This Visit   ? ?  ? Unprioritized  ? Vitamin D deficiency - Primary  ?  Taking bariatric vit D supplement.   ? ?  ?  ? Relevant Orders  ? Vitamin D (25 hydroxy)  ? Perimenopause  ?  Discussed some of the common symptoms.  She denies significant hot flashes at this time. Does not get period following her endometrial ablation.  ? ?  ?  ? OCD (obsessive compulsive disorder)  ?  Stable on prozac, continue same.  ? ?  ?  ? Hypertension  ?  BP Readings from Last 3 Encounters:  ?04/23/22 132/89  ?03/01/22 110/84  ?02/18/22 (!) 175/115  ?Stable on amlodipine and metoprolol.  ?  ?  ? Relevant Orders  ? Basic metabolic panel  ? GERD (gastroesophageal reflux disease)  ?  Stable. Recommended that she change omeprazole to PRN and focus on dietary choices.  ? ?  ?  ? Relevant Medications  ? omeprazole (PRILOSEC) 40 MG capsule  ? Depression with anxiety  ?   Stable on prozac. Continue same.  ? ?  ?  ? B12 deficiency  ?  She is taking a bariatric oral b12. Not currently taking b12 injection. Will check follow up level.  ? ?  ?  ? Relevant Orders  ? B12  ? ? ? ? ?Meds order

## 2022-04-23 NOTE — Assessment & Plan Note (Signed)
Discussed some of the common symptoms.  She denies significant hot flashes at this time. Does not get period following her endometrial ablation.  ?

## 2022-04-23 NOTE — Assessment & Plan Note (Addendum)
She is taking a bariatric oral b12. Not currently taking b12 injection. Will check follow up level.  ?

## 2022-04-23 NOTE — Assessment & Plan Note (Signed)
Taking bariatric vit D supplement.   ?

## 2022-05-10 ENCOUNTER — Telehealth: Payer: BC Managed Care – PPO | Admitting: Emergency Medicine

## 2022-05-10 DIAGNOSIS — L249 Irritant contact dermatitis, unspecified cause: Secondary | ICD-10-CM

## 2022-05-10 MED ORDER — PREDNISONE 20 MG PO TABS: ORAL_TABLET | ORAL | 0 refills | Status: AC

## 2022-05-10 MED ORDER — TRIAMCINOLONE ACETONIDE 0.5 % EX OINT: 1.0000 "application " | TOPICAL_OINTMENT | Freq: Two times a day (BID) | CUTANEOUS | 0 refills | Status: AC

## 2022-05-10 NOTE — Progress Notes (Signed)
Virtual Visit Consent   Sandra Nichols, you are scheduled for a virtual visit with a [facility] provider today. Just as with appointments in the office, your consent must be obtained to participate. Your consent will be active for this visit and any virtual visit you may have with one of our providers in the next 365 days. If you have a MyChart account, a copy of this consent can be sent to you electronically.  As this is a virtual visit, video technology does not allow for your provider to perform a traditional examination. This may limit your provider's ability to fully assess your condition. If your provider identifies any concerns that need to be evaluated in person or the need to arrange testing (such as labs, EKG, etc.), we will make arrangements to do so. Although advances in technology are sophisticated, we cannot ensure that it will always work on either your end or our end. If the connection with a video visit is poor, the visit may have to be switched to a telephone visit. With either a video or telephone visit, we are not always able to ensure that we have a secure connection.  By engaging in this virtual visit, you consent to the provision of healthcare and authorize for your insurance to be billed (if applicable) for the services provided during this visit. Depending on your insurance coverage, you may receive a charge related to this service.  I need to obtain your verbal consent now. Are you willing to proceed with your visit today? Sandra Nichols has provided verbal consent on 05/10/2022 for a virtual visit (video or telephone). Roxy Horseman, PA-C  Date: 05/10/2022 10:25 AM  Virtual Visit via Video Note   I, Roxy Horseman, connected with  Sandra Nichols  ([id number], [id number]) on 05/10/22 at 10:15 AM EDT by a video-enabled telemedicine application and verified that I am speaking with the correct person using two identifiers.  Location: Patient: Virtual Visit Location Patient: Home Provider:  Virtual Visit Location Provider: Home Office   I discussed the limitations of evaluation and management by telemedicine and the availability of in person appointments. The patient expressed understanding and agreed to proceed.    History of Present Illness: Sandra Nichols is a 46 y.o. who identifies as a female who was assigned female at birth, and is being seen today for chief complaint of rash.  Was doing some gardening about a week ago.  Was cutting down a large bush.  Thinks she may have been exposed to a poison ivy or the like.  States that she has had something similar last summer.    HPI: HPI  Problems:  Patient Active Problem List   Diagnosis Date Noted   Perimenopause 04/23/2022   GERD (gastroesophageal reflux disease) 04/23/2022   Achilles tendinosis of left lower extremity 03/01/2022   B12 deficiency 10/26/2021   Vitamin D deficiency 10/26/2021   Preventative health care 10/24/2021   Right lateral epicondylitis 12/11/2017   Hypertension 12/11/2017   Depression with anxiety 08/08/2017   Insomnia 08/08/2017   OCD (obsessive compulsive disorder) 08/08/2017   Obesity (BMI 30-39.9) 02/18/2013   s/p Lap Roux-en-Y gastric bypass 04/20/2012    Allergies:  Allergies  Allergen Reactions   Epinephrine Other (See Comments) and Palpitations    syncope   Prednisone Other (See Comments)    Psychological rxn: pt reports "losing time"   Influenza Vac Subunit Quad     Localized swelling at injection site. States she has tolerated flu mist in the past.  Nsaids     Pt stated, "I have had gastric bypass surgery and can't take"   Medications:  Current Outpatient Medications:    predniSONE (DELTASONE) 20 MG tablet, Take 40 mg by mouth daily for 5 days, then 20mg  by mouth daily for 5 days, then 10mg  daily for 5 days, Disp: 18 tablet, Rfl: 0   triamcinolone ointment (KENALOG) 0.5 %, Apply 1 application. topically 2 (two) times daily., Disp: 30 g, Rfl: 0   amLODipine (NORVASC) 10 MG tablet,  TAKE 1 TABLET(10 MG) BY MOUTH DAILY, Disp: 90 tablet, Rfl: 1   Cholecalciferol (VITAMIN D3) 75 MCG (3000 UT) TABS, Take 1 tablet by mouth daily., Disp: 30 tablet, Rfl:    cyanocobalamin (,VITAMIN B-12,) 1000 MCG/ML injection, IM once weekly for 4 weeks, then once monthly., Disp: 16 mL, Rfl: 1   FLUoxetine (PROZAC) 40 MG capsule, TAKE 1 CAPSULE(40 MG) BY MOUTH EVERY MORNING, Disp: 90 capsule, Rfl: 1   MELATONIN PO, Take by mouth. Take 6 mg daily, Disp: , Rfl:    metoprolol succinate (TOPROL-XL) 50 MG 24 hr tablet, TAKE 1 TABLET(50 MG) BY MOUTH DAILY WITH OR IMMEDIATELY FOLLOWING A MEAL, Disp: 90 tablet, Rfl: 1   Multiple Vitamin (DAILY-VITAMIN PO), Take by mouth., Disp: , Rfl:    nitroGLYCERIN (NITRODUR - DOSED IN MG/24 HR) 0.2 mg/hr patch, Cut and apply 1/4 patch to most painful area q24h., Disp: 30 patch, Rfl: 11   omeprazole (PRILOSEC) 40 MG capsule, Take 1 capsule (40 mg total) by mouth daily as needed. TAKE 1 CAPSULE(40 MG) BY MOUTH DAILY, Disp: 30 capsule, Rfl: 3  Observations/Objective: Patient is well-developed, well-nourished in no acute distress.  Resting comfortably  at home.  Head is normocephalic, atraumatic.  No labored breathing.  Speech is clear and coherent with logical content.  Patient is alert and oriented at baseline.    Assessment and Plan: 1. Irritant contact dermatitis, unspecified trigger  Trial prednisone.  Discussed adverse effect of "time loss" and patient is ok with trialing it. Kenalog cream.  Follow Up Instructions: I discussed the assessment and treatment plan with the patient. The patient was provided an opportunity to ask questions and all were answered. The patient agreed with the plan and demonstrated an understanding of the instructions.  A copy of instructions were sent to the patient via MyChart unless otherwise noted below.     The patient was advised to call back or seek an in-person evaluation if the symptoms worsen or if the condition  fails to improve as anticipated.  Time:  I spent 10 minutes with the patient via telehealth technology discussing the above problems/concerns.    , PA-C

## 2022-06-06 DIAGNOSIS — L814 Other melanin hyperpigmentation: Secondary | ICD-10-CM | POA: Diagnosis not present

## 2022-06-06 DIAGNOSIS — D485 Neoplasm of uncertain behavior of skin: Secondary | ICD-10-CM | POA: Diagnosis not present

## 2022-06-06 DIAGNOSIS — D2261 Melanocytic nevi of right upper limb, including shoulder: Secondary | ICD-10-CM | POA: Diagnosis not present

## 2022-06-06 DIAGNOSIS — D225 Melanocytic nevi of trunk: Secondary | ICD-10-CM | POA: Diagnosis not present

## 2022-06-06 DIAGNOSIS — D1801 Hemangioma of skin and subcutaneous tissue: Secondary | ICD-10-CM | POA: Diagnosis not present

## 2022-08-29 DIAGNOSIS — D485 Neoplasm of uncertain behavior of skin: Secondary | ICD-10-CM | POA: Diagnosis not present

## 2022-08-29 DIAGNOSIS — L988 Other specified disorders of the skin and subcutaneous tissue: Secondary | ICD-10-CM | POA: Diagnosis not present

## 2022-10-22 ENCOUNTER — Telehealth: Payer: Self-pay

## 2022-10-22 NOTE — Telephone Encounter (Signed)
Appt rescheduled

## 2022-10-22 NOTE — Telephone Encounter (Signed)
Caller Name Alba Phone Number [phone] Patient Name Sandra Nichols Patient DOB November 16, 1976 Call Type Message Only Information Provided Reason for Call Request to Reschedule Office Appointment Initial Comment Caller states she needs to reschedule a appointment Patient request to speak to RN No Disp. Time Disposition Final User 10/22/2022 7:54:04 AM General Information Provided Yes Laurelyn Sickle Call Closed By: Laurelyn Sickle Transaction Date/Time: 10/22/2022 7:52:23 AM (ET)

## 2022-10-25 ENCOUNTER — Encounter: Payer: BC Managed Care – PPO | Admitting: Family

## 2022-10-31 DIAGNOSIS — Z1231 Encounter for screening mammogram for malignant neoplasm of breast: Secondary | ICD-10-CM | POA: Diagnosis not present

## 2022-11-26 ENCOUNTER — Encounter: Payer: BC Managed Care – PPO | Admitting: Family

## 2022-11-28 ENCOUNTER — Encounter: Payer: Self-pay | Admitting: Family

## 2022-11-29 ENCOUNTER — Ambulatory Visit (INDEPENDENT_AMBULATORY_CARE_PROVIDER_SITE_OTHER): Payer: BC Managed Care – PPO | Admitting: Family

## 2022-11-29 ENCOUNTER — Other Ambulatory Visit (HOSPITAL_COMMUNITY)
Admission: RE | Admit: 2022-11-29 | Discharge: 2022-11-29 | Disposition: A | Discharge status: Home / Self Care | Payer: BC Managed Care – PPO | Source: Ambulatory Visit | Attending: Family | Admitting: Family

## 2022-11-29 ENCOUNTER — Encounter: Payer: Self-pay | Admitting: Family

## 2022-11-29 VITALS — BP 130/80 | HR 56 | Temp 98.1°F | Resp 16 | Ht 63.0 in | Wt 205.4 lb

## 2022-11-29 DIAGNOSIS — F418 Other specified anxiety disorders: Secondary | ICD-10-CM

## 2022-11-29 DIAGNOSIS — Z0000 Encounter for general adult medical examination without abnormal findings: Secondary | ICD-10-CM

## 2022-11-29 DIAGNOSIS — E538 Deficiency of other specified B group vitamins: Secondary | ICD-10-CM | POA: Diagnosis not present

## 2022-11-29 DIAGNOSIS — I10 Essential (primary) hypertension: Secondary | ICD-10-CM | POA: Diagnosis not present

## 2022-11-29 DIAGNOSIS — Z01419 Encounter for gynecological examination (general) (routine) without abnormal findings: Secondary | ICD-10-CM | POA: Diagnosis not present

## 2022-11-29 DIAGNOSIS — E669 Obesity, unspecified: Secondary | ICD-10-CM | POA: Diagnosis not present

## 2022-11-29 DIAGNOSIS — E559 Vitamin D deficiency, unspecified: Secondary | ICD-10-CM | POA: Diagnosis not present

## 2022-11-29 LAB — VITAMIN D 25 HYDROXY (VIT D DEFICIENCY, FRACTURES): VITD: 27.93 ng/mL — ABNORMAL LOW (ref 30.00–100.00)

## 2022-11-29 LAB — COMPREHENSIVE METABOLIC PANEL
ALT: 22 U/L (ref 0–35)
AST: 18 U/L (ref 0–37)
Albumin: 4.6 g/dL (ref 3.5–5.2)
Alkaline Phosphatase: 103 U/L (ref 39–117)
BUN: 14 mg/dL (ref 6–23)
CO2: 29 mEq/L (ref 19–32)
Calcium: 9.9 mg/dL (ref 8.4–10.5)
Chloride: 100 mEq/L (ref 96–112)
Creatinine, Ser: 0.6 mg/dL (ref 0.40–1.20)
GFR: 105.85 mL/min (ref >60.00)
Glucose, Bld: 89 mg/dL (ref 70–99)
Potassium: 4.3 mEq/L (ref 3.5–5.1)
Sodium: 135 mEq/L (ref 135–145)
Total Bilirubin: 0.5 mg/dL (ref 0.2–1.2)
Total Protein: 7.6 g/dL (ref 6.0–8.3)

## 2022-11-29 LAB — LIPID PANEL
Cholesterol: 182 mg/dL (ref 0–200)
HDL: 70.6 mg/dL (ref >39.00)
LDL Cholesterol: 93 mg/dL (ref 0–99)
NonHDL: 111.82
Total CHOL/HDL Ratio: 3
Triglycerides: 95 mg/dL (ref 0.0–149.0)
VLDL: 19 mg/dL (ref 0.0–40.0)

## 2022-11-29 LAB — VITAMIN B12: Vitamin B-12: 209 pg/mL — ABNORMAL LOW (ref 211–911)

## 2022-11-29 LAB — TSH: TSH: 2.07 u[IU]/mL (ref 0.35–5.50)

## 2022-11-29 NOTE — Assessment & Plan Note (Addendum)
Discussed diet/exercise/weight loss. Wants to increase exercise. Mammo up to date. Pap today. Colo up to date.  Obtain labs as ordered.

## 2022-11-29 NOTE — Assessment & Plan Note (Signed)
Discussed risks/benefits of wegovy. She wishes to do some more research and focus on improving her diet/exercise for now.

## 2022-11-29 NOTE — Assessment & Plan Note (Signed)
She stopped her b12 injections as she thought she no longer needed them. Will repeat b12. Advised pt to restart monthly injections.

## 2022-11-29 NOTE — Assessment & Plan Note (Signed)
On mvi with minerals.  Recheck level.

## 2022-11-29 NOTE — Progress Notes (Signed)
Subjective:   By signing my name below, I, Sandra Nichols, attest that this documentation has been prepared under the direction and in the presence of Dennison, NP 11/29/2022     Patient ID: Sandra Nichols, female    DOB: 1976-09-11, 46 y.o.   MRN: [id number]  Chief Complaint  Patient presents with   Annual Exam    Would like to discuss wt loss meds    HPI Patient is in today for a comprehensive physical exam  Weight Loss: She wants to research more about weight loss injections.  Wt Readings from Last 3 Encounters:  11/29/22 205 lb 6 oz (93.2 kg)  04/23/22 205 lb (93 kg)  03/07/22 204 lb (92.5 kg)   Hip Pain: She reports that she pulled her hip muscle when using the restroom. She has a chronic history of hip dysplasia therefore, when she overexerts her hips, the area becomes painful.   Vitamin B-12: She used to do the monthly B-12 injections and discontinued because her numbers were well.   Vitamin D: Her vitamin D levels are normal. She is no longer taking the 3000 units of Vitamin D3 supplement but rather a multivitamin containing D3.   Cholesterol: Her cholesterol levels are normal. Lab Results  Component Value Date   CHOL 155 10/24/2021   HDL 46.30 10/24/2021   LDLCALC 91 10/24/2021   TRIG 89.0 10/24/2021   CHOLHDL 3 10/24/2021   She denies having any fever, hearing or vision symptoms, new muscle pain, joint pain , new moles, rashes, congestion, sinus pain, sore throat, palpations, cough, SOB ,wheezing,n/v/d constipation, blood in stool, dysuria, frequency, hematuria, depression, anxiety, headaches at this time  Social history: She reports no recent surgeries. She states that her brother is diagnosed with pre-diabetes and skin cancer. Her uncle passed from lung cancer but was a heavy smoker. Her paternal grandfather passed from a myocardial infraction.  Colonoscopy: Last completed on 01/10/2022 Pap Smear: Last completed on 07/06/2019. She is interested in  receiving an updated pap smear during today's visit.  Mammogram: Last completed on 10/31/2022 Immunizations: She is UTD on the Covid vaccine which she received on 10/25/2022.    Exercise: She states that she is struggling to exercise due to injury to her achilles tendon. She uses nitroglycerin patches but they do not completely alleviate symptoms. The patches also comes off easily. She mostly walks as exercise and when she goes to the gym, she enjoys using the elliptical.  Dental: She is UTD on dental exams  Vision: She is UTD on vision exams   Health Maintenance Due  Topic Date Due   PAP SMEAR-Modifier  07/05/2022    Past Medical History:  Diagnosis Date   Anemia    Breast cyst, left 07/25/2017   3 cysts. Resume screening in 1 yr per pt.   COVID-19 10/2019   Depression    GERD (gastroesophageal reflux disease)    Hypertension    Irritable bowel syndrome    OCD (obsessive compulsive disorder)    Panic attack    Personal history of congenital hip dysplasia     Past Surgical History:  Procedure Laterality Date   ENDOMETRIAL ABLATION     GASTRIC ROUX-EN-Y  04/20/2012   Procedure: LAPAROSCOPIC ROUX-EN-Y GASTRIC BYPASS WITH UPPER ENDOSCOPY;  Surgeon: Gayland Curry, MD,FACS;  Location: [facility];  Service: General;;   LASIK Bilateral 2015   MYOMECTOMY     TUBAL LIGATION  01/27/2002    Family History  Problem Relation Age  of Onset   Heart disease Mother    Diabetes Mother    Depression Mother        committed suicide at age 89   Alcohol abuse Mother    Hypertension Mother    Alcohol abuse Father        died from murder   Diabetes Sister    CAD Sister        s/p bypass,    Obesity Sister        weighs 400 lbs   Uterine cancer Sister    Skin cancer Brother    Heart disease Maternal Grandfather    Colon cancer Paternal Grandmother    Cancer Paternal Grandmother        colon   Heart attack Paternal Grandfather    Esophageal cancer Neg Hx    Stomach cancer Neg Hx    Rectal  cancer Neg Hx     Social History   Socioeconomic History   Marital status: Married    Spouse name: Not on file   Number of children: Not on file   Years of education: Not on file   Highest education level: Not on file  Occupational History   Not on file  Tobacco Use   Smoking status: Never   Smokeless tobacco: Never  Vaping Use   Vaping Use: Never used  Substance and Sexual Activity   Alcohol use: Yes    Comment: 4 drinks on the weekend   Drug use: No   Sexual activity: Yes    Partners: Male    Birth control/protection: Surgical    Comment: ablation  Other Topics Concern   Not on file  Social History Narrative   Married   Works in Optometrist work at [employer]   3 biological children   1996- son [name] (he has one son)   44- [name] son   2003-[name]      2 children "non-official foster children" teens, live with her   Enjoys watching netflix, movies, spending time with her children, going out to eat   Completed a Oceanographer in [employer]   Social Determinants of Health   Financial Resource Strain: Not on file  Food Insecurity: Not on file  Transportation Needs: Not on file  Physical Activity: Not on file  Stress: Not on file  Social Connections: Not on file  Intimate Partner Violence: Not on file    Outpatient Medications Prior to Visit  Medication Sig Dispense Refill   amLODipine (NORVASC) 10 MG tablet TAKE 1 TABLET(10 MG) BY MOUTH DAILY 90 tablet 1   Cholecalciferol (VITAMIN D3) 75 MCG (3000 UT) TABS Take 1 tablet by mouth daily. 30 tablet    cyanocobalamin (,VITAMIN B-12,) 1000 MCG/ML injection 1035mg IM once weekly for 4 weeks, then once monthly. 16 mL 1   FLUoxetine (PROZAC) 40 MG capsule TAKE 1 CAPSULE(40 MG) BY MOUTH EVERY MORNING 90 capsule 1   MELATONIN PO Take by mouth. Take 6 mg daily     metoprolol succinate (TOPROL-XL) 50 MG 24 hr tablet TAKE 1 TABLET(50 MG) BY MOUTH DAILY WITH OR IMMEDIATELY FOLLOWING A MEAL 90 tablet 1   Multiple Vitamin  (DAILY-VITAMIN PO) Take by mouth.     nitroGLYCERIN (NITRODUR - DOSED IN MG/24 HR) 0.2 mg/hr patch Cut and apply 1/4 patch to most painful area q24h. 30 patch 11   omeprazole (PRILOSEC) 40 MG capsule Take 1 capsule (40 mg total) by mouth daily as needed. TAKE 1 CAPSULE(40 MG) BY MOUTH DAILY  30 capsule 3   predniSONE (DELTASONE) 20 MG tablet Take 40 mg by mouth daily for 5 days, then 36m by mouth daily for 5 days, then 177mdaily for 5 days 18 tablet 0   triamcinolone ointment (KENALOG) 0.5 % Apply 1 application. topically 2 (two) times daily. 30 g 0   No facility-administered medications prior to visit.    Allergies  Allergen Reactions   Epinephrine Other (See Comments) and Palpitations    syncope   Prednisone Other (See Comments)    Psychological rxn: pt reports "losing time"   Influenza Vac Subunit Quad     Localized swelling at injection site. States she has tolerated flu mist in the past.   Nsaids     Pt stated, "I have had gastric bypass surgery and can't take"    Review of Systems  Constitutional:  Negative for fever.  HENT:  Negative for congestion, sinus pain and sore throat.   Respiratory:  Negative for cough, shortness of breath and wheezing.   Cardiovascular:  Negative for palpitations.  Gastrointestinal:  Negative for blood in stool, constipation, diarrhea, nausea and vomiting.  Genitourinary:  Negative for dysuria, frequency and hematuria.  Musculoskeletal:  Negative for joint pain and myalgias.  Skin:  Negative for rash.       (-) New Moles  Neurological:  Negative for headaches.  Psychiatric/Behavioral:  Negative for depression. The patient is not nervous/anxious.        Objective:    Physical Exam Constitutional:      General: She is not in acute distress.    Appearance: Normal appearance. She is not ill-appearing.  HENT:     Head: Normocephalic and atraumatic.     Right Ear: Tympanic membrane, ear canal and external ear normal.     Left Ear: Tympanic  membrane, ear canal and external ear normal.     Mouth/Throat:     Pharynx: No oropharyngeal exudate.  Eyes:     Extraocular Movements: Extraocular movements intact.     Pupils: Pupils are equal, round, and reactive to light.  Neck:     Thyroid: No thyromegaly.  Cardiovascular:     Rate and Rhythm: Normal rate and regular rhythm.     Heart sounds: Normal heart sounds. No murmur heard.    No gallop.  Pulmonary:     Effort: Pulmonary effort is normal. No respiratory distress.     Breath sounds: Normal breath sounds. No wheezing or rales.  Chest:  Breasts:    Right: No mass or nipple discharge.     Left: No mass or nipple discharge.  Abdominal:     General: Bowel sounds are normal. There is no distension.     Palpations: Abdomen is soft.     Tenderness: There is no abdominal tenderness. There is no guarding.  Genitourinary:    General: Normal vulva.     Urethra: No prolapse, urethral pain, urethral swelling or urethral lesion.     Vagina: Normal.     Cervix: Normal.     Uterus: Normal.      Adnexa:        Right: No mass or tenderness.         Left: No mass or tenderness.       Rectum: Normal.  Lymphadenopathy:     Cervical: No cervical adenopathy.     Upper Body:     Right upper body: No axillary adenopathy.     Left upper body: No axillary adenopathy.  Lower Body: No right inguinal adenopathy. No left inguinal adenopathy.  Skin:    General: Skin is warm and dry.  Neurological:     Mental Status: She is alert and oriented to person, place, and time.     Deep Tendon Reflexes:     Reflex Scores:      Patellar reflexes are 2+ on the right side and 2+ on the left side. Psychiatric:        Mood and Affect: Mood normal.        Behavior: Behavior normal.        Judgment: Judgment normal.     BP 130/80 (BP Location: Right Arm, Patient Position: Sitting, Cuff Size: Large)   Pulse (!) 56   Temp 98.1 F (36.7 C) (Oral)   Resp 16   Ht 5' 3" (1.6 m)   Wt 205 lb 6 oz  (93.2 kg)   SpO2 100%   BMI 36.38 kg/m  Wt Readings from Last 3 Encounters:  11/29/22 205 lb 6 oz (93.2 kg)  04/23/22 205 lb (93 kg)  03/07/22 204 lb (92.5 kg)       Assessment & Plan:   Problem List Items Addressed This Visit       Unprioritized   Vitamin D deficiency    On mvi with minerals.  Recheck level.       Relevant Orders   VITAMIN D 25 Hydroxy (Vit-D Deficiency, Fractures)   Preventative health care - Primary    Discussed diet/exercise/weight loss. Wants to increase exercise. Mammo up to date. Pap today. Colo up to date.  Obtain labs as ordered.        Relevant Orders   TSH   Lipid panel   Obesity (BMI 30-39.9)    Discussed risks/benefits of wegovy. She wishes to do some more research and focus on improving her diet/exercise for now.       Hypertension    Initial bp elevated. Repeat WNL. Continue amlodipine, metoprolol. She will continue to monitor at home and let us know if she develops elevated readings at home.       Relevant Orders   Comp Met (CMET)   Depression with anxiety    Stable on prozac 37m. Continue same.       B12 deficiency    She stopped her b12 injections as she thought she no longer needed them. Will repeat b12. Advised pt to restart monthly injections.       Relevant Orders   B12   Other Visit Diagnoses     Encounter for routine gynecological examination with Papanicolaou smear of cervix       Relevant Orders   Cytology - PAP( [place] [facility])      No orders of the defined types were placed in this encounter.   I, MNance Pear NP, personally preformed the services described in this documentation.  All medical record entries made by the scribe were at my direction and in my presence.  I have reviewed the chart and discharge instructions (if applicable) and agree that the record reflects my personal performance and is accurate and complete. 11/29/2022   I,Amber Collins,acting as a scribe for MNance Pear  NP.,have documented all relevant documentation on the behalf of MNance Pear NP,as directed by  MNance Pear NP while in the presence of MNance Pear NP.    MNance Pear NP

## 2022-11-29 NOTE — Assessment & Plan Note (Signed)
Initial bp elevated. Repeat WNL. Continue amlodipine, metoprolol. She will continue to monitor at home and let us know if she develops elevated readings at home.

## 2022-11-29 NOTE — Assessment & Plan Note (Signed)
Stable on prozac 40mg. Continue same.  

## 2022-12-01 ENCOUNTER — Encounter: Payer: Self-pay | Admitting: Family

## 2022-12-01 ENCOUNTER — Other Ambulatory Visit: Payer: Self-pay | Admitting: Family

## 2022-12-01 MED ORDER — CYANOCOBALAMIN 1000 MCG/ML IJ SOLN: INTRAMUSCULAR | 0 refills | Status: AC

## 2022-12-03 LAB — CYTOLOGY - PAP
Comment: NEGATIVE
Diagnosis: NEGATIVE
High risk HPV: NEGATIVE

## 2022-12-24 ENCOUNTER — Other Ambulatory Visit: Payer: Self-pay | Admitting: Family

## 2022-12-24 ENCOUNTER — Encounter: Payer: Self-pay | Admitting: Family

## 2022-12-24 MED ORDER — SCOPOLAMINE 1 MG/3DAYS TD PT72: 1.0000 | MEDICATED_PATCH | TRANSDERMAL | 0 refills | Status: AC

## 2023-01-07 DIAGNOSIS — R52 Pain, unspecified: Secondary | ICD-10-CM | POA: Diagnosis not present

## 2023-01-07 DIAGNOSIS — J019 Acute sinusitis, unspecified: Secondary | ICD-10-CM | POA: Diagnosis not present

## 2023-01-07 DIAGNOSIS — Z20822 Contact with and (suspected) exposure to covid-19: Secondary | ICD-10-CM | POA: Diagnosis not present

## 2023-01-07 DIAGNOSIS — B9789 Other viral agents as the cause of diseases classified elsewhere: Secondary | ICD-10-CM | POA: Diagnosis not present

## 2023-01-07 DIAGNOSIS — R0981 Nasal congestion: Secondary | ICD-10-CM | POA: Diagnosis not present

## 2023-02-08 ENCOUNTER — Other Ambulatory Visit: Payer: Self-pay | Admitting: Family

## 2023-03-18 ENCOUNTER — Other Ambulatory Visit: Payer: Self-pay | Admitting: Family

## 2023-03-29 DIAGNOSIS — W19XXXA Unspecified fall, initial encounter: Secondary | ICD-10-CM | POA: Diagnosis not present

## 2023-03-29 DIAGNOSIS — Z23 Encounter for immunization: Secondary | ICD-10-CM | POA: Diagnosis not present

## 2023-03-29 DIAGNOSIS — S01511A Laceration without foreign body of lip, initial encounter: Secondary | ICD-10-CM | POA: Diagnosis not present

## 2023-03-29 DIAGNOSIS — W109XXA Fall (on) (from) unspecified stairs and steps, initial encounter: Secondary | ICD-10-CM | POA: Diagnosis not present

## 2023-03-29 DIAGNOSIS — S066X0A Traumatic subarachnoid hemorrhage without loss of consciousness, initial encounter: Secondary | ICD-10-CM | POA: Diagnosis not present

## 2023-03-29 DIAGNOSIS — R609 Edema, unspecified: Secondary | ICD-10-CM | POA: Diagnosis not present

## 2023-03-29 DIAGNOSIS — F10129 Alcohol abuse with intoxication, unspecified: Secondary | ICD-10-CM | POA: Diagnosis not present

## 2023-03-29 DIAGNOSIS — R55 Syncope and collapse: Secondary | ICD-10-CM | POA: Diagnosis not present

## 2023-03-29 DIAGNOSIS — F1090 Alcohol use, unspecified, uncomplicated: Secondary | ICD-10-CM | POA: Diagnosis not present

## 2023-03-29 DIAGNOSIS — S022XXA Fracture of nasal bones, initial encounter for closed fracture: Secondary | ICD-10-CM | POA: Diagnosis not present

## 2023-03-29 DIAGNOSIS — M546 Pain in thoracic spine: Secondary | ICD-10-CM | POA: Diagnosis not present

## 2023-03-29 DIAGNOSIS — M5459 Other low back pain: Secondary | ICD-10-CM | POA: Diagnosis not present

## 2023-03-29 DIAGNOSIS — S0121XA Laceration without foreign body of nose, initial encounter: Secondary | ICD-10-CM | POA: Diagnosis not present

## 2023-03-29 DIAGNOSIS — I10 Essential (primary) hypertension: Secondary | ICD-10-CM | POA: Diagnosis not present

## 2023-03-29 DIAGNOSIS — S0990XA Unspecified injury of head, initial encounter: Secondary | ICD-10-CM | POA: Diagnosis not present

## 2023-03-29 DIAGNOSIS — Y908 Blood alcohol level of 240 mg/100 ml or more: Secondary | ICD-10-CM | POA: Diagnosis not present

## 2023-03-29 DIAGNOSIS — Z043 Encounter for examination and observation following other accident: Secondary | ICD-10-CM | POA: Diagnosis not present

## 2023-03-30 DIAGNOSIS — Z043 Encounter for examination and observation following other accident: Secondary | ICD-10-CM | POA: Diagnosis not present

## 2023-03-30 DIAGNOSIS — M546 Pain in thoracic spine: Secondary | ICD-10-CM | POA: Diagnosis not present

## 2023-03-30 DIAGNOSIS — S022XXA Fracture of nasal bones, initial encounter for closed fracture: Secondary | ICD-10-CM | POA: Diagnosis not present

## 2023-03-30 DIAGNOSIS — M5459 Other low back pain: Secondary | ICD-10-CM | POA: Diagnosis not present

## 2023-04-07 ENCOUNTER — Encounter: Payer: Self-pay | Admitting: *Deleted

## 2023-06-02 ENCOUNTER — Ambulatory Visit: Payer: BC Managed Care – PPO | Admitting: Family

## 2023-06-09 ENCOUNTER — Ambulatory Visit: Payer: BC Managed Care – PPO | Admitting: Family

## 2023-06-13 DIAGNOSIS — Z1330 Encounter for screening examination for mental health and behavioral disorders, unspecified: Secondary | ICD-10-CM | POA: Diagnosis not present

## 2023-06-13 DIAGNOSIS — E559 Vitamin D deficiency, unspecified: Secondary | ICD-10-CM | POA: Diagnosis not present

## 2023-06-13 DIAGNOSIS — I10 Essential (primary) hypertension: Secondary | ICD-10-CM | POA: Diagnosis not present

## 2023-06-13 DIAGNOSIS — Z13220 Encounter for screening for lipoid disorders: Secondary | ICD-10-CM | POA: Diagnosis not present

## 2023-06-13 DIAGNOSIS — E538 Deficiency of other specified B group vitamins: Secondary | ICD-10-CM | POA: Diagnosis not present

## 2023-07-25 DIAGNOSIS — F411 Generalized anxiety disorder: Secondary | ICD-10-CM | POA: Diagnosis not present

## 2023-07-25 DIAGNOSIS — M6281 Muscle weakness (generalized): Secondary | ICD-10-CM | POA: Diagnosis not present

## 2023-07-25 DIAGNOSIS — F330 Major depressive disorder, recurrent, mild: Secondary | ICD-10-CM | POA: Diagnosis not present

## 2023-07-25 DIAGNOSIS — M542 Cervicalgia: Secondary | ICD-10-CM | POA: Diagnosis not present

## 2023-07-25 DIAGNOSIS — M25511 Pain in right shoulder: Secondary | ICD-10-CM | POA: Diagnosis not present

## 2023-07-25 DIAGNOSIS — W108XXA Fall (on) (from) other stairs and steps, initial encounter: Secondary | ICD-10-CM | POA: Diagnosis not present

## 2023-07-25 DIAGNOSIS — R220 Localized swelling, mass and lump, head: Secondary | ICD-10-CM | POA: Diagnosis not present

## 2023-08-13 DIAGNOSIS — D2262 Melanocytic nevi of left upper limb, including shoulder: Secondary | ICD-10-CM | POA: Diagnosis not present

## 2023-08-13 DIAGNOSIS — D225 Melanocytic nevi of trunk: Secondary | ICD-10-CM | POA: Diagnosis not present

## 2023-08-13 DIAGNOSIS — D2261 Melanocytic nevi of right upper limb, including shoulder: Secondary | ICD-10-CM | POA: Diagnosis not present

## 2023-08-13 DIAGNOSIS — L918 Other hypertrophic disorders of the skin: Secondary | ICD-10-CM | POA: Diagnosis not present

## 2023-08-15 DIAGNOSIS — M25511 Pain in right shoulder: Secondary | ICD-10-CM | POA: Diagnosis not present

## 2023-08-15 DIAGNOSIS — M542 Cervicalgia: Secondary | ICD-10-CM | POA: Diagnosis not present

## 2023-08-15 DIAGNOSIS — M6281 Muscle weakness (generalized): Secondary | ICD-10-CM | POA: Diagnosis not present

## 2023-08-20 DIAGNOSIS — R220 Localized swelling, mass and lump, head: Secondary | ICD-10-CM | POA: Diagnosis not present

## 2023-08-20 DIAGNOSIS — S0512XA Contusion of eyeball and orbital tissues, left eye, initial encounter: Secondary | ICD-10-CM | POA: Diagnosis not present

## 2023-08-20 DIAGNOSIS — S022XXD Fracture of nasal bones, subsequent encounter for fracture with routine healing: Secondary | ICD-10-CM | POA: Diagnosis not present

## 2023-08-22 DIAGNOSIS — M6281 Muscle weakness (generalized): Secondary | ICD-10-CM | POA: Diagnosis not present

## 2023-08-22 DIAGNOSIS — M542 Cervicalgia: Secondary | ICD-10-CM | POA: Diagnosis not present

## 2023-08-22 DIAGNOSIS — M25511 Pain in right shoulder: Secondary | ICD-10-CM | POA: Diagnosis not present

## 2023-09-10 DIAGNOSIS — R200 Anesthesia of skin: Secondary | ICD-10-CM | POA: Diagnosis not present

## 2023-09-10 DIAGNOSIS — I10 Essential (primary) hypertension: Secondary | ICD-10-CM | POA: Diagnosis not present

## 2023-09-10 DIAGNOSIS — Z23 Encounter for immunization: Secondary | ICD-10-CM | POA: Diagnosis not present

## 2023-09-10 DIAGNOSIS — F330 Major depressive disorder, recurrent, mild: Secondary | ICD-10-CM | POA: Diagnosis not present

## 2023-09-10 DIAGNOSIS — F411 Generalized anxiety disorder: Secondary | ICD-10-CM | POA: Diagnosis not present

## 2023-11-13 DIAGNOSIS — Z1231 Encounter for screening mammogram for malignant neoplasm of breast: Secondary | ICD-10-CM | POA: Diagnosis not present

## 2023-11-13 DIAGNOSIS — R92333 Mammographic heterogeneous density, bilateral breasts: Secondary | ICD-10-CM | POA: Diagnosis not present

## 2023-11-14 DIAGNOSIS — F330 Major depressive disorder, recurrent, mild: Secondary | ICD-10-CM | POA: Diagnosis not present

## 2023-11-14 DIAGNOSIS — F411 Generalized anxiety disorder: Secondary | ICD-10-CM | POA: Diagnosis not present

## 2024-01-28 ENCOUNTER — Other Ambulatory Visit: Payer: Self-pay | Admitting: Family

## 2024-04-30 ENCOUNTER — Other Ambulatory Visit: Payer: Self-pay | Admitting: Family
